# Patient Record
Sex: Female | Born: 1980 | Race: White | Hispanic: No | Marital: Married | Smoking: Never smoker
Health system: Southern US, Community
[De-identification: ages and names within clinical notes are randomized; demographics above are authoritative.]

## PROBLEM LIST (undated history)

## (undated) ENCOUNTER — Inpatient Hospital Stay (HOSPITAL_COMMUNITY): Payer: Self-pay

## (undated) DIAGNOSIS — R102 Pelvic and perineal pain: Secondary | ICD-10-CM

## (undated) DIAGNOSIS — Z973 Presence of spectacles and contact lenses: Secondary | ICD-10-CM

## (undated) DIAGNOSIS — J45909 Unspecified asthma, uncomplicated: Secondary | ICD-10-CM

## (undated) DIAGNOSIS — K5900 Constipation, unspecified: Secondary | ICD-10-CM

## (undated) HISTORY — PX: WISDOM TOOTH EXTRACTION: SHX21

## (undated) HISTORY — PX: BREAST MASS EXCISION: SHX1267

## (undated) HISTORY — PX: BREAST EXCISIONAL BIOPSY: SUR124

---

## 2004-05-26 ENCOUNTER — Other Ambulatory Visit: Admission: RE | Admit: 2004-05-26 | Discharge: 2004-05-26 | Payer: Self-pay | Admitting: Family Medicine

## 2005-05-18 ENCOUNTER — Other Ambulatory Visit: Admission: RE | Admit: 2005-05-18 | Discharge: 2005-05-18 | Payer: Self-pay | Admitting: Obstetrics and Gynecology

## 2008-02-22 ENCOUNTER — Emergency Department (HOSPITAL_COMMUNITY): Admission: EM | Admit: 2008-02-22 | Discharge: 2008-02-22 | Payer: Self-pay | Admitting: Emergency Medicine

## 2008-03-14 ENCOUNTER — Ambulatory Visit (HOSPITAL_COMMUNITY): Admission: RE | Admit: 2008-03-14 | Discharge: 2008-03-14 | Payer: Self-pay | Admitting: Family Medicine

## 2010-07-18 ENCOUNTER — Emergency Department (HOSPITAL_COMMUNITY): Admission: EM | Admit: 2010-07-18 | Discharge: 2010-07-18 | Payer: Self-pay | Admitting: Family Medicine

## 2010-12-30 LAB — POCT URINALYSIS DIPSTICK
Ketones, ur: NEGATIVE mg/dL
Protein, ur: NEGATIVE mg/dL
Specific Gravity, Urine: 1.015 (ref 1.005–1.030)
Urobilinogen, UA: 0.2 mg/dL (ref 0.0–1.0)

## 2011-04-04 ENCOUNTER — Other Ambulatory Visit: Payer: Self-pay | Admitting: Family Medicine

## 2011-04-04 DIAGNOSIS — R1900 Intra-abdominal and pelvic swelling, mass and lump, unspecified site: Secondary | ICD-10-CM

## 2011-04-07 ENCOUNTER — Ambulatory Visit
Admission: RE | Admit: 2011-04-07 | Discharge: 2011-04-07 | Disposition: A | Discharge status: Home / Self Care | Payer: No Typology Code available for payment source | Source: Ambulatory Visit | Attending: Family Medicine | Admitting: Family Medicine

## 2011-04-07 DIAGNOSIS — R1900 Intra-abdominal and pelvic swelling, mass and lump, unspecified site: Secondary | ICD-10-CM

## 2013-03-23 DIAGNOSIS — J45909 Unspecified asthma, uncomplicated: Secondary | ICD-10-CM | POA: Insufficient documentation

## 2013-09-26 LAB — OB RESULTS CONSOLE HEPATITIS B SURFACE ANTIGEN
Hepatitis B Surface Ag: NEGATIVE
Hepatitis B Surface Ag: NEGATIVE

## 2013-09-26 LAB — OB RESULTS CONSOLE HIV ANTIBODY (ROUTINE TESTING)
HIV: NONREACTIVE
HIV: NONREACTIVE

## 2013-09-26 LAB — OB RESULTS CONSOLE ANTIBODY SCREEN
Antibody Screen: NEGATIVE
Antibody Screen: NEGATIVE

## 2013-09-26 LAB — OB RESULTS CONSOLE ABO/RH
"RH Type ": POSITIVE
RH Type: POSITIVE

## 2013-09-26 LAB — OB RESULTS CONSOLE RPR: RPR: NONREACTIVE

## 2013-09-26 LAB — OB RESULTS CONSOLE RUBELLA ANTIBODY, IGM
Rubella: IMMUNE
Rubella: IMMUNE

## 2013-10-17 NOTE — L&D Delivery Note (Signed)
Pt completed the first stage without difficulty. She pushed for 1 1/2 hours and developed exhaustion. The simpson forceps were placed at +3 station in the LOA position. She delivered one live viable white female infant over a second degree midline tear. Placenta - S/I. Nuchal cord x 1 Tear closed with 2-0 and 3-0 Vicryl. EBL- 400cc. Uterine atony was txd with methergine x 1.

## 2014-01-07 ENCOUNTER — Inpatient Hospital Stay (HOSPITAL_COMMUNITY)
Admission: AD | Admit: 2014-01-07 | Discharge: 2014-01-07 | Disposition: A | Discharge status: Home / Self Care | Payer: 59 | Source: Ambulatory Visit | Attending: Obstetrics and Gynecology | Admitting: Obstetrics and Gynecology

## 2014-01-07 ENCOUNTER — Encounter (HOSPITAL_COMMUNITY): Payer: Self-pay

## 2014-01-07 DIAGNOSIS — O4700 False labor before 37 completed weeks of gestation, unspecified trimester: Secondary | ICD-10-CM | POA: Insufficient documentation

## 2014-01-07 DIAGNOSIS — O99891 Other specified diseases and conditions complicating pregnancy: Secondary | ICD-10-CM | POA: Insufficient documentation

## 2014-01-07 DIAGNOSIS — O479 False labor, unspecified: Secondary | ICD-10-CM

## 2014-01-07 DIAGNOSIS — K5900 Constipation, unspecified: Secondary | ICD-10-CM | POA: Insufficient documentation

## 2014-01-07 DIAGNOSIS — K6289 Other specified diseases of anus and rectum: Secondary | ICD-10-CM | POA: Insufficient documentation

## 2014-01-07 DIAGNOSIS — O9989 Other specified diseases and conditions complicating pregnancy, childbirth and the puerperium: Secondary | ICD-10-CM

## 2014-01-07 HISTORY — DX: Constipation, unspecified: K59.00

## 2014-01-07 HISTORY — DX: Unspecified asthma, uncomplicated: J45.909

## 2014-01-07 MED ORDER — DOCUSATE SODIUM 100 MG PO CAPS: 100.0000 mg | ORAL_CAPSULE | Freq: Two times a day (BID) | ORAL | Status: DC

## 2014-01-07 MED ORDER — POLYETHYLENE GLYCOL 3350 17 GM/SCOOP PO POWD: 1.0000 | Freq: Once | ORAL | Status: DC

## 2014-01-07 NOTE — MAU Note (Signed)
Patient states she is severely constipated and having pain and bleeding Had a small hard BM today and tried to disimpact herself and only got out a little. Denies vaginal bleeding or discharge. Has not felt fetal movement today.

## 2014-01-07 NOTE — Discharge Instructions (Signed)
Braxton Hicks Contractions Pregnancy is commonly associated with contractions of the uterus throughout the pregnancy. Towards the end of pregnancy (32 to 34 weeks), these contractions [facility]mael Holter) can develop more often and may become more forceful. This is not true labor because these contractions do not result in opening (dilatation) and thinning of the cervix. They are sometimes difficult to tell apart from true labor because these contractions can be forceful and people have different pain tolerances. You should not feel embarrassed if you go to the hospital with false labor. Sometimes, the only way to tell if you are in true labor is for your caregiver to follow the changes in the cervix. How to tell the difference between true and false labor:  False labor.  The contractions of false labor are usually shorter, irregular and not as hard as those of true labor.  They are often felt in the front of the lower abdomen and in the groin.  They may leave with walking around or changing positions while lying down.  They get weaker and are shorter lasting as time goes on.  These contractions are usually irregular.  They do not usually become progressively stronger, regular and closer together as with true labor.  True labor.  Contractions in true labor last 30 to 70 seconds, become very regular, usually become more intense, and increase in frequency.  They do not go away with walking.  The discomfort is usually felt in the top of the uterus and spreads to the lower abdomen and low back.  True labor can be determined by your caregiver with an exam. This will show that the cervix is dilating and getting thinner. If there are no prenatal problems or other health problems associated with the pregnancy, it is completely safe to be sent home with false labor and await the onset of true labor. HOME CARE INSTRUCTIONS   Keep up with your usual exercises and instructions.  Take medications as  directed.  Keep your regular prenatal appointment.  Eat and drink lightly if you think you are going into labor.  If BH contractions are making you uncomfortable:  Change your activity position from lying down or resting to walking/walking to resting.  Sit and rest in a tub of warm water.  Drink 2 to 3 glasses of water. Dehydration may cause B-H contractions.  Do slow and deep breathing several times an hour. SEEK IMMEDIATE MEDICAL CARE IF:   Your contractions continue to become stronger, more regular, and closer together.  You have a gushing, burst or leaking of fluid from the vagina.  An oral temperature above 102 F (38.9 C) develops.  You have passage of blood-tinged mucus.  You develop vaginal bleeding.  You develop continuous belly (abdominal) pain.  You have low back pain that you never had before.  You feel the baby's head pushing down causing pelvic pressure.  The baby is not moving as much as it used to. Document Released: 10/03/2005 Document Revised: 12/26/2011 Document Reviewed: 07/15/2013 [facility] Patient Information 2014 [place], [place]. Constipation, Adult Constipation is when a person has fewer than 3 bowel movements a week; has difficulty having a bowel movement; or has stools that are dry, hard, or larger than normal. As people grow older, constipation is more common. If you try to fix constipation with medicines that make you have a bowel movement (laxatives), the problem may get worse. Long-term laxative use may cause the muscles of the colon to become weak. A low-fiber diet, not taking in  enough fluids, and taking certain medicines may make constipation worse. CAUSES   Certain medicines, such as antidepressants, pain medicine, iron supplements, antacids, and water pills.   Certain diseases, such as diabetes, irritable bowel syndrome (IBS), thyroid disease, or depression.   Not drinking enough water.   Not eating enough fiber-rich foods.    Stress or travel.  Lack of physical activity or exercise.  Not going to the restroom when there is the urge to have a bowel movement.  Ignoring the urge to have a bowel movement.  Using laxatives too much. SYMPTOMS   Having fewer than 3 bowel movements a week.   Straining to have a bowel movement.   Having hard, dry, or larger than normal stools.   Feeling full or bloated.   Pain in the lower abdomen.  Not feeling relief after having a bowel movement. DIAGNOSIS  Your caregiver will take a medical history and perform a physical exam. Further testing may be done for severe constipation. Some tests may include:   A barium enema X-ray to examine your rectum, colon, and sometimes, your small intestine.  A sigmoidoscopy to examine your lower colon.  A colonoscopy to examine your entire colon. TREATMENT  Treatment will depend on the severity of your constipation and what is causing it. Some dietary treatments include drinking more fluids and eating more fiber-rich foods. Lifestyle treatments may include regular exercise. If these diet and lifestyle recommendations do not help, your caregiver may recommend taking over-the-counter laxative medicines to help you have bowel movements. Prescription medicines may be prescribed if over-the-counter medicines do not work.  HOME CARE INSTRUCTIONS   Increase dietary fiber in your diet, such as fruits, vegetables, whole grains, and beans. Limit high-fat and processed sugars in your diet, such as [place] fries, hamburgers, cookies, candies, and soda.   A fiber supplement may be added to your diet if you cannot get enough fiber from foods.   Drink enough fluids to keep your urine clear or pale yellow.   Exercise regularly or as directed by your caregiver.   Go to the restroom when you have the urge to go. Do not hold it.  Only take medicines as directed by your caregiver. Do not take other medicines for constipation without talking  to your caregiver first. [place] IF:   You have bright red blood in your stool.   Your constipation lasts for more than 4 days or gets worse.   You have abdominal or rectal pain.   You have thin, pencil-like stools.  You have unexplained weight loss. MAKE SURE YOU:   Understand these instructions.  Will watch your condition.  Will get help right away if you are not doing well or get worse. Document Released: 07/01/2004 Document Revised: 12/26/2011 Document Reviewed: 07/15/2013 [facility] Patient Information 2014 [place], [place]. Fetal Movement Counts Patient Name: __________________________________________________ Patient Due Date: ____________________ Performing a fetal movement count is highly recommended in high-risk pregnancies, but it is good for every pregnant woman to do. Your caregiver may ask you to start counting fetal movements at 28 weeks of the pregnancy. Fetal movements often increase:  After eating a full meal.  After physical activity.  After eating or drinking something sweet or cold.  At rest. Pay attention to when you feel the baby is most active. This will help you notice a pattern of your baby's sleep and wake cycles and what factors contribute to an increase in fetal movement. It is important to perform a fetal  movement count at the same time each day when your baby is normally most active.  HOW TO COUNT FETAL MOVEMENTS 1. Find a quiet and comfortable area to sit or lie down on your left side. Lying on your left side provides the best blood and oxygen circulation to your baby. 2. Write down the day and time on a sheet of paper or in a journal. 3. Start counting kicks, flutters, swishes, rolls, or jabs in a 2 hour period. You should feel at least 10 movements within 2 hours. 4. If you do not feel 10 movements in 2 hours, wait 2 3 hours and count again. Look for a change in the pattern or not enough counts in 2 hours. SEEK MEDICAL CARE  IF:  You feel less than 10 counts in 2 hours, tried twice.  There is no movement in over an hour.  The pattern is changing or taking longer each day to reach 10 counts in 2 hours.  You feel the baby is not moving as he or she usually does. Date: ____________ Movements: ____________ Start time: ____________ Bonnie Atkins time: ____________  Date: ____________ Movements: ____________ Start time: ____________ Bonnie Atkins time: ____________ Date: ____________ Movements: ____________ Start time: ____________ Bonnie Atkins time: ____________ Date: ____________ Movements: ____________ Start time: ____________ Bonnie Atkins time: ____________ Date: ____________ Movements: ____________ Start time: ____________ Bonnie Atkins time: ____________ Date: ____________ Movements: ____________ Start time: ____________ Bonnie Atkins time: ____________ Date: ____________ Movements: ____________ Start time: ____________ Bonnie Atkins time: ____________ Date: ____________ Movements: ____________ Start time: ____________ Bonnie Atkins time: ____________  Date: ____________ Movements: ____________ Start time: ____________ Bonnie Atkins time: ____________ Date: ____________ Movements: ____________ Start time: ____________ Bonnie Atkins time: ____________ Date: ____________ Movements: ____________ Start time: ____________ Bonnie Atkins time: ____________ Date: ____________ Movements: ____________ Start time: ____________ Bonnie Atkins time: ____________ Date: ____________ Movements: ____________ Start time: ____________ Bonnie Atkins time: ____________ Date: ____________ Movements: ____________ Start time: ____________ Bonnie Atkins time: ____________ Date: ____________ Movements: ____________ Start time: ____________ Bonnie Atkins time: ____________  Date: ____________ Movements: ____________ Start time: ____________ Bonnie Atkins time: ____________ Date: ____________ Movements: ____________ Start time: ____________ Bonnie Atkins time: ____________ Date: ____________ Movements: ____________ Start time: ____________ Bonnie Atkins time:  ____________ Date: ____________ Movements: ____________ Start time: ____________ Bonnie Atkins time: ____________ Date: ____________ Movements: ____________ Start time: ____________ Bonnie Atkins time: ____________ Date: ____________ Movements: ____________ Start time: ____________ Bonnie Atkins time: ____________ Date: ____________ Movements: ____________ Start time: ____________ Bonnie Atkins time: ____________  Date: ____________ Movements: ____________ Start time: ____________ Bonnie Atkins time: ____________ Date: ____________ Movements: ____________ Start time: ____________ Bonnie Atkins time: ____________ Date: ____________ Movements: ____________ Start time: ____________ Bonnie Atkins time: ____________ Date: ____________ Movements: ____________ Start time: ____________ Bonnie Atkins time: ____________ Date: ____________ Movements: ____________ Start time: ____________ Bonnie Atkins time: ____________ Date: ____________ Movements: ____________ Start time: ____________ Bonnie Atkins time: ____________ Date: ____________ Movements: ____________ Start time: ____________ Bonnie Atkins time: ____________  Date: ____________ Movements: ____________ Start time: ____________ Bonnie Atkins time: ____________ Date: ____________ Movements: ____________ Start time: ____________ Bonnie Atkins time: ____________ Date: ____________ Movements: ____________ Start time: ____________ Bonnie Atkins time: ____________ Date: ____________ Movements: ____________ Start time: ____________ Bonnie Atkins time: ____________ Date: ____________ Movements: ____________ Start time: ____________ Bonnie Atkins time: ____________ Date: ____________ Movements: ____________ Start time: ____________ Bonnie Atkins time: ____________ Date: ____________ Movements: ____________ Start time: ____________ Bonnie Atkins time: ____________  Date: ____________ Movements: ____________ Start time: ____________ Bonnie Atkins time: ____________ Date: ____________ Movements: ____________ Start time: ____________ Bonnie Atkins time: ____________ Date: ____________ Movements:  ____________ Start time: ____________ Bonnie Atkins time: ____________ Date: ____________ Movements: ____________ Start time: ____________ Bonnie Atkins time: ____________ Date: ____________ Movements: ____________ Start time: ____________ Bonnie Atkins time: ____________ Date:  ____________ Movements: ____________ Start time: ____________ Bonnie Atkins time: ____________ Date: ____________ Movements: ____________ Start time: ____________ Bonnie Atkins time: ____________  Date: ____________ Movements: ____________ Start time: ____________ Bonnie Atkins time: ____________ Date: ____________ Movements: ____________ Start time: ____________ Bonnie Atkins time: ____________ Date: ____________ Movements: ____________ Start time: ____________ Bonnie Atkins time: ____________ Date: ____________ Movements: ____________ Start time: ____________ Bonnie Atkins time: ____________ Date: ____________ Movements: ____________ Start time: ____________ Bonnie Atkins time: ____________ Date: ____________ Movements: ____________ Start time: ____________ Bonnie Atkins time: ____________ Date: ____________ Movements: ____________ Start time: ____________ Bonnie Atkins time: ____________  Date: ____________ Movements: ____________ Start time: ____________ Bonnie Atkins time: ____________ Date: ____________ Movements: ____________ Start time: ____________ Bonnie Atkins time: ____________ Date: ____________ Movements: ____________ Start time: ____________ Bonnie Atkins time: ____________ Date: ____________ Movements: ____________ Start time: ____________ Bonnie Atkins time: ____________ Date: ____________ Movements: ____________ Start time: ____________ Bonnie Atkins time: ____________ Date: ____________ Movements: ____________ Start time: ____________ Bonnie Atkins time: ____________ Document Released: 11/02/2006 Document Revised: 09/19/2012 Document Reviewed: 07/30/2012 [employer] Patient Information 2014 [place].

## 2014-01-07 NOTE — MAU Provider Note (Signed)
History     CSN: [id number]  Arrival date and time: 01/07/14 1806   First Provider Initiated Contact with Patient 01/07/14 2003      Chief Complaint  Patient presents with  . Constipation   HPI Bonnie Atkins is a 33 y.o. G1P0 at [redacted]w[redacted]d who presents to MAU today with complaint of severe rectal pain and constipation. The patient states that it became very painful around 1700 today. She has also developed some mild nausea without vomiting. She denies fever. Her last normal BM was 2-3 days ago. She states that she attempted to manually disimpact herself with little success. She is now also having anal leakage and occasional scant rectal bleeding. She denies vaginal bleeding, contractions, LOF. She reports good fetal movement.   OB History   Grav Para Term Preterm Abortions TAB SAB Ect Mult Living   1         0      Past Medical History  Diagnosis Date  . Asthma   . Constipation     Past Surgical History  Procedure Laterality Date  . Breast mass excision      Benign  . Wisdom tooth extraction      No family history on file.  History  Substance Use Topics  . Smoking status: Never Smoker   . Smokeless tobacco: Never Used  . Alcohol Use: No    Allergies:  Allergies  Allergen Reactions  . Erythromycin Anaphylaxis  . Sulfa Antibiotics Nausea And Vomiting  . Codeine Nausea And Vomiting and Rash  . Fish-Derived Products Palpitations    No prescriptions prior to admission    Review of Systems  Constitutional: Negative for fever and malaise/fatigue.  Gastrointestinal: Positive for nausea, diarrhea and constipation. Negative for vomiting and abdominal pain.  Genitourinary:       Neg - vaginal bleeding, discharge, LOF   Physical Exam   Blood pressure 119/72, pulse 89, temperature 98.2 F (36.8 C), temperature source Oral, resp. rate 18, height 5' 4.5" (1.638 m), weight 144 lb (65.318 kg), SpO2 100.00%.  Physical Exam  Constitutional: She is oriented to person,  place, and time. She appears well-developed and well-nourished. No distress.  HENT:  Head: Normocephalic and atraumatic.  Cardiovascular: Normal rate.   Respiratory: Effort normal.  GI: Soft. She exhibits no distension and no mass. There is no tenderness. There is no rebound and no guarding.  Neurological: She is alert and oriented to person, place, and time.  Skin: Skin is warm and dry. No erythema.  Psychiatric: She has a normal mood and affect.  Dilation: Closed Effacement (%): Thick Cervical Position: Anterior Exam by:: J Ethier PA-C  Fetal Monitoring: Baseline: 135 bpm, moderate variability, 10 x 10 accelerations, no decelerations Contractions: q 2-5 minutes progressing to mild UI after PO hydration  MAU Course  Procedures None  MDM Discussed with Dr. Ouida Sills. Plan to use enema, check cervix. If no dilation patient can be sent home with precautions. Ok with plan of care Enema given. Patient able to have BM x 2 and significant relief of rectal pain PO hydration > Uterine contractions slowed to mild UI Patient denies contractions or abdominal pain Cervix closed  Assessment and Plan  A: Braxton Hicks contractions Constipation  P: Discharge home Rx for Miralax and Colace given to patient Advised increased PO hydration Preterm labor precautions and kick counts discussed Patient to follow-up with [facility] as scheduled or sooner if symptoms persist or worsen Patient may return to MAU as needed  or if her condtiion were to change or worsen  Farris Has, PA-C  01/08/2014, 12:38 AM

## 2014-01-22 LAB — OB RESULTS CONSOLE RPR: RPR: NONREACTIVE

## 2014-03-18 LAB — OB RESULTS CONSOLE GBS
GBS: NEGATIVE
GBS: NEGATIVE

## 2014-04-12 ENCOUNTER — Inpatient Hospital Stay (HOSPITAL_COMMUNITY)
Admission: AD | Admit: 2014-04-12 | Discharge: 2014-04-13 | Disposition: A | Discharge status: Home / Self Care | Payer: 59 | Source: Ambulatory Visit | Attending: Obstetrics and Gynecology | Admitting: Obstetrics and Gynecology

## 2014-04-12 DIAGNOSIS — O479 False labor, unspecified: Secondary | ICD-10-CM | POA: Insufficient documentation

## 2014-04-12 NOTE — MAU Note (Signed)
contractions 

## 2014-04-13 ENCOUNTER — Inpatient Hospital Stay (HOSPITAL_COMMUNITY)
Admission: AD | Admit: 2014-04-13 | Discharge: 2014-04-15 | DRG: 775 | Disposition: A | Discharge status: Home / Self Care | Payer: 59 | Source: Ambulatory Visit | Attending: Obstetrics and Gynecology | Admitting: Obstetrics and Gynecology

## 2014-04-13 ENCOUNTER — Encounter (HOSPITAL_COMMUNITY): Payer: Self-pay | Admitting: *Deleted

## 2014-04-13 ENCOUNTER — Encounter (HOSPITAL_COMMUNITY): Payer: 59 | Admitting: Anesthesiology

## 2014-04-13 ENCOUNTER — Inpatient Hospital Stay (HOSPITAL_COMMUNITY): Payer: 59 | Admitting: Anesthesiology

## 2014-04-13 DIAGNOSIS — D6859 Other primary thrombophilia: Secondary | ICD-10-CM | POA: Diagnosis present

## 2014-04-13 DIAGNOSIS — O9912 Other diseases of the blood and blood-forming organs and certain disorders involving the immune mechanism complicating childbirth: Secondary | ICD-10-CM

## 2014-04-13 DIAGNOSIS — IMO0001 Reserved for inherently not codable concepts without codable children: Secondary | ICD-10-CM

## 2014-04-13 DIAGNOSIS — J45909 Unspecified asthma, uncomplicated: Secondary | ICD-10-CM | POA: Diagnosis present

## 2014-04-13 DIAGNOSIS — Z3480 Encounter for supervision of other normal pregnancy, unspecified trimester: Secondary | ICD-10-CM

## 2014-04-13 DIAGNOSIS — D689 Coagulation defect, unspecified: Secondary | ICD-10-CM | POA: Diagnosis present

## 2014-04-13 DIAGNOSIS — O479 False labor, unspecified: Secondary | ICD-10-CM | POA: Diagnosis present

## 2014-04-13 LAB — CBC
HEMATOCRIT: 38.2 % (ref 36.0–46.0)
HEMOGLOBIN: 13 g/dL (ref 12.0–15.0)
MCH: 30.7 pg (ref 26.0–34.0)
MCHC: 34 g/dL (ref 30.0–36.0)
MCV: 90.1 fL (ref 78.0–100.0)
Platelets: 206 10*3/uL (ref 150–400)
RBC: 4.24 MIL/uL (ref 3.87–5.11)
RDW: 13.8 % (ref 11.5–15.5)
WBC: 18.5 10*3/uL — ABNORMAL HIGH (ref 4.0–10.5)

## 2014-04-13 LAB — RPR

## 2014-04-13 MED ORDER — SENNOSIDES-DOCUSATE SODIUM 8.6-50 MG PO TABS
2.0000 | ORAL_TABLET | ORAL | Status: DC
  Administered 2014-04-14 (×2): 2 via ORAL
  Filled 2014-04-13 (×2): qty 2

## 2014-04-13 MED ORDER — ONDANSETRON HCL 4 MG/2ML IJ SOLN: 4.0000 mg | INTRAMUSCULAR | Status: DC | PRN

## 2014-04-13 MED ORDER — FLEET ENEMA 7-19 GM/118ML RE ENEM: 1.0000 | ENEMA | RECTAL | Status: DC | PRN

## 2014-04-13 MED ORDER — ZOLPIDEM TARTRATE 5 MG PO TABS: 5.0000 mg | ORAL_TABLET | Freq: Every evening | ORAL | Status: DC | PRN

## 2014-04-13 MED ORDER — DIPHENHYDRAMINE HCL 50 MG/ML IJ SOLN: 12.5000 mg | INTRAMUSCULAR | Status: DC | PRN

## 2014-04-13 MED ORDER — PHENYLEPHRINE 40 MCG/ML (10ML) SYRINGE FOR IV PUSH (FOR BLOOD PRESSURE SUPPORT)
80.0000 ug | PREFILLED_SYRINGE | INTRAVENOUS | Status: DC | PRN
  Filled 2014-04-13: qty 10
  Filled 2014-04-13: qty 2

## 2014-04-13 MED ORDER — PHENYLEPHRINE 40 MCG/ML (10ML) SYRINGE FOR IV PUSH (FOR BLOOD PRESSURE SUPPORT)
80.0000 ug | PREFILLED_SYRINGE | INTRAVENOUS | Status: DC | PRN
  Filled 2014-04-13: qty 2

## 2014-04-13 MED ORDER — IBUPROFEN 600 MG PO TABS
600.0000 mg | ORAL_TABLET | Freq: Four times a day (QID) | ORAL | Status: DC
  Administered 2014-04-14 – 2014-04-15 (×6): 600 mg via ORAL
  Filled 2014-04-13 (×6): qty 1

## 2014-04-13 MED ORDER — TETANUS-DIPHTH-ACELL PERTUSSIS 5-2.5-18.5 LF-MCG/0.5 IM SUSP
0.5000 mL | Freq: Once | INTRAMUSCULAR | Status: AC
  Administered 2014-04-14: 0.5 mL via INTRAMUSCULAR
  Filled 2014-04-13: qty 0.5

## 2014-04-13 MED ORDER — FENTANYL 2.5 MCG/ML BUPIVACAINE 1/10 % EPIDURAL INFUSION (WH - ANES)
INTRAMUSCULAR | Status: DC | PRN
  Administered 2014-04-13: 14 mL/h via EPIDURAL

## 2014-04-13 MED ORDER — LIDOCAINE HCL (PF) 1 % IJ SOLN
INTRAMUSCULAR | Status: DC | PRN
  Administered 2014-04-13 (×2): 9 mL

## 2014-04-13 MED ORDER — ONDANSETRON HCL 4 MG/2ML IJ SOLN
4.0000 mg | Freq: Four times a day (QID) | INTRAMUSCULAR | Status: DC | PRN
  Administered 2014-04-13: 4 mg via INTRAVENOUS
  Filled 2014-04-13: qty 2

## 2014-04-13 MED ORDER — LACTATED RINGERS IV SOLN
500.0000 mL | Freq: Once | INTRAVENOUS | Status: AC
  Administered 2014-04-13: 500 mL via INTRAVENOUS

## 2014-04-13 MED ORDER — DIBUCAINE 1 % RE OINT: 1.0000 "application " | TOPICAL_OINTMENT | RECTAL | Status: DC | PRN

## 2014-04-13 MED ORDER — LACTATED RINGERS IV SOLN: 500.0000 mL | INTRAVENOUS | Status: DC | PRN

## 2014-04-13 MED ORDER — IBUPROFEN 600 MG PO TABS
600.0000 mg | ORAL_TABLET | Freq: Four times a day (QID) | ORAL | Status: DC | PRN
  Administered 2014-04-13: 600 mg via ORAL
  Filled 2014-04-13: qty 1

## 2014-04-13 MED ORDER — EPHEDRINE 5 MG/ML INJ
10.0000 mg | INTRAVENOUS | Status: DC | PRN
  Filled 2014-04-13: qty 4
  Filled 2014-04-13: qty 2

## 2014-04-13 MED ORDER — FENTANYL 2.5 MCG/ML BUPIVACAINE 1/10 % EPIDURAL INFUSION (WH - ANES)
14.0000 mL/h | INTRAMUSCULAR | Status: DC | PRN
  Administered 2014-04-13: 14 mL/h via EPIDURAL
  Filled 2014-04-13: qty 125

## 2014-04-13 MED ORDER — ZOLPIDEM TARTRATE 5 MG PO TABS
5.0000 mg | ORAL_TABLET | Freq: Once | ORAL | Status: AC
  Administered 2014-04-13: 5 mg via ORAL
  Filled 2014-04-13: qty 1

## 2014-04-13 MED ORDER — OXYCODONE-ACETAMINOPHEN 5-325 MG PO TABS
1.0000 | ORAL_TABLET | ORAL | Status: DC | PRN
  Administered 2014-04-14 (×2): 1 via ORAL
  Filled 2014-04-13 (×2): qty 1

## 2014-04-13 MED ORDER — LIDOCAINE HCL (PF) 1 % IJ SOLN
30.0000 mL | INTRAMUSCULAR | Status: DC | PRN
  Filled 2014-04-13: qty 30

## 2014-04-13 MED ORDER — CITRIC ACID-SODIUM CITRATE 334-500 MG/5ML PO SOLN: 30.0000 mL | ORAL | Status: DC | PRN

## 2014-04-13 MED ORDER — ALBUTEROL SULFATE (2.5 MG/3ML) 0.083% IN NEBU: 3.0000 mL | INHALATION_SOLUTION | Freq: Four times a day (QID) | RESPIRATORY_TRACT | Status: DC | PRN

## 2014-04-13 MED ORDER — WITCH HAZEL-GLYCERIN EX PADS: 1.0000 "application " | MEDICATED_PAD | CUTANEOUS | Status: DC | PRN

## 2014-04-13 MED ORDER — METHYLERGONOVINE MALEATE 0.2 MG/ML IJ SOLN
INTRAMUSCULAR | Status: AC
  Administered 2014-04-13: 0.2 mg
  Filled 2014-04-13: qty 1

## 2014-04-13 MED ORDER — LACTATED RINGERS IV SOLN
INTRAVENOUS | Status: DC
  Administered 2014-04-13 (×2): via INTRAVENOUS

## 2014-04-13 MED ORDER — ONDANSETRON HCL 4 MG PO TABS: 4.0000 mg | ORAL_TABLET | ORAL | Status: DC | PRN

## 2014-04-13 MED ORDER — MEASLES, MUMPS & RUBELLA VAC ~~LOC~~ INJ
0.5000 mL | INJECTION | Freq: Once | SUBCUTANEOUS | Status: DC
  Filled 2014-04-13: qty 0.5

## 2014-04-13 MED ORDER — OXYTOCIN BOLUS FROM INFUSION
500.0000 mL | INTRAVENOUS | Status: DC
  Administered 2014-04-13: 500 mL via INTRAVENOUS

## 2014-04-13 MED ORDER — SIMETHICONE 80 MG PO CHEW: 80.0000 mg | CHEWABLE_TABLET | ORAL | Status: DC | PRN

## 2014-04-13 MED ORDER — BENZOCAINE-MENTHOL 20-0.5 % EX AERO
1.0000 "application " | INHALATION_SPRAY | CUTANEOUS | Status: DC | PRN
  Administered 2014-04-15: 1 via TOPICAL
  Filled 2014-04-13 (×2): qty 56

## 2014-04-13 MED ORDER — OXYTOCIN 40 UNITS IN LACTATED RINGERS INFUSION - SIMPLE MED
62.5000 mL/h | INTRAVENOUS | Status: DC
Start: 2014-04-13 — End: 2014-04-13
  Filled 2014-04-13: qty 1000

## 2014-04-13 MED ORDER — EPHEDRINE 5 MG/ML INJ
10.0000 mg | INTRAVENOUS | Status: DC | PRN
  Filled 2014-04-13: qty 2

## 2014-04-13 MED ORDER — ACETAMINOPHEN 325 MG PO TABS: 650.0000 mg | ORAL_TABLET | ORAL | Status: DC | PRN

## 2014-04-13 NOTE — H&P (Signed)
33 y.o. [redacted]w[redacted]d  G1P0 comes in c/o labor.  Otherwise has good fetal movement and no bleeding.  Past Medical History  Diagnosis Date  . Constipation   . Asthma     Past Surgical History  Procedure Laterality Date  . Breast mass excision      Benign  . Wisdom tooth extraction      OB History  Gravida Para Term Preterm AB SAB TAB Ectopic Multiple Living  1         0    # Outcome Date GA Lbr Len/2nd Weight Sex Delivery Anes PTL Lv  1 CUR               History   Social History  . Marital Status: Married    Spouse Name: N/A    Number of Children: N/A  . Years of Education: N/A   Occupational History  . Not on file.   Social History Main Topics  . Smoking status: Never Smoker   . Smokeless tobacco: Never Used  . Alcohol Use: No  . Drug Use: No  . Sexual Activity: Yes    Birth Control/ Protection: None   Other Topics Concern  . Not on file   Social History Narrative  . No narrative on file   Erythromycin; Sulfa antibiotics; Codeine; and Fish-derived products    Prenatal Transfer Tool  Maternal Diabetes: No Genetic Screening: Normal Maternal Ultrasounds/Referrals: Normal Fetal Ultrasounds or other Referrals:  None Maternal Substance Abuse:  No Significant Maternal Medications:  None Significant Maternal Lab Results: None  Other PNC: uncomplicated.    Filed Vitals:   04/13/14 1323  BP:   Pulse:   Temp: 97.9 F (36.6 C)  Resp:      Lungs/Cor:  NAD Abdomen:  soft, gravid Ex:  no cords, erythema SVE:  7/C/-2, AROM light mec FHTs:  120, good STV, NST R Toco:  q 2-3   A/P   Term labor.  GBS neg.  HORVATH,MICHELLE A

## 2014-04-13 NOTE — MAU Note (Signed)
Pt. States she has been contracting since 1500 today. Denies leakage of fluid. Feels that she passed her mucous plug Wednesday. Had last appointment with OB Wednesday and was closed on exam. Pt. States she is nauseated. Baby is moving well. Denies bleeding.

## 2014-04-13 NOTE — Anesthesia Procedure Notes (Signed)
Epidural Patient location during procedure: OB Start time: 04/13/2014 11:46 AM End time: 04/13/2014 11:50 AM  Staffing Anesthesiologist: Lyn Hollingshead Performed by: anesthesiologist   Preanesthetic Checklist Completed: patient identified, surgical consent, pre-op evaluation, timeout performed, IV checked, risks and benefits discussed and monitors and equipment checked  Epidural Patient position: sitting Prep: site prepped and draped and DuraPrep Patient monitoring: continuous pulse ox and blood pressure Approach: midline Location: L3-L4 Injection technique: LOR air  Needle:  Needle type: Tuohy  Needle gauge: 17 G Needle length: 9 cm and 9 Needle insertion depth: 4 cm Catheter type: closed end flexible Catheter size: 19 Gauge Catheter at skin depth: 9 cm Test dose: negative and Other  Assessment Sensory level: T9 Events: blood not aspirated, injection not painful, no injection resistance, negative IV test and no paresthesia  Additional Notes Reason for block:procedure for pain

## 2014-04-13 NOTE — Anesthesia Preprocedure Evaluation (Signed)
Anesthesia Evaluation  Patient identified by MRN, date of birth, ID band Patient awake    Reviewed: Allergy & Precautions, H&P , NPO status , Patient's Chart, lab work & pertinent test results  Airway Mallampati: I TM Distance: >3 FB Neck ROM: full    Dental no notable dental hx.    Pulmonary    Pulmonary exam normal       Cardiovascular negative cardio ROS      Neuro/Psych negative neurological ROS  negative psych ROS   GI/Hepatic negative GI ROS, Neg liver ROS,   Endo/Other  negative endocrine ROS  Renal/GU negative Renal ROS     Musculoskeletal   Abdominal Normal abdominal exam  (+)   Peds  Hematology negative hematology ROS (+)   Anesthesia Other Findings   Reproductive/Obstetrics (+) Pregnancy                           Anesthesia Physical Anesthesia Plan  ASA: II  Anesthesia Plan: Epidural   Post-op Pain Management:    Induction:   Airway Management Planned:   Additional Equipment:   Intra-op Plan:   Post-operative Plan:   Informed Consent: I have reviewed the patients History and Physical, chart, labs and discussed the procedure including the risks, benefits and alternatives for the proposed anesthesia with the patient or authorized representative who has indicated his/her understanding and acceptance.     Plan Discussed with:   Anesthesia Plan Comments:         Anesthesia Quick Evaluation

## 2014-04-13 NOTE — Discharge Instructions (Signed)
Fetal Movement Counts Patient Name: __________________________________________________ Patient Due Date: ____________________ Performing a fetal movement count is highly recommended in high-risk pregnancies, but it is good for every pregnant woman to do. Your caregiver may ask you to start counting fetal movements at 28 weeks of the pregnancy. Fetal movements often increase:  After eating a full meal.  After physical activity.  After eating or drinking something sweet or cold.  At rest. Pay attention to when you feel the baby is most active. This will help you notice a pattern of your baby's sleep and wake cycles and what factors contribute to an increase in fetal movement. It is important to perform a fetal movement count at the same time each day when your baby is normally most active.  HOW TO COUNT FETAL MOVEMENTS 1. Find a quiet and comfortable area to sit or lie down on your left side. Lying on your left side provides the best blood and oxygen circulation to your baby. 2. Write down the day and time on a sheet of paper or in a journal. 3. Start counting kicks, flutters, swishes, rolls, or jabs in a 2 hour period. You should feel at least 10 movements within 2 hours. 4. If you do not feel 10 movements in 2 hours, wait 2-3 hours and count again. Look for a change in the pattern or not enough counts in 2 hours. SEEK MEDICAL CARE IF:  You feel less than 10 counts in 2 hours, tried twice.  There is no movement in over an hour.  The pattern is changing or taking longer each day to reach 10 counts in 2 hours.  You feel the baby is not moving as he or she usually does. Date: ____________ Movements: ____________ Start time: ____________ Elizebeth Koller time: ____________  Date: ____________ Movements: ____________ Start time: ____________ Elizebeth Koller time: ____________ Date: ____________ Movements: ____________ Start time: ____________ Elizebeth Koller time: ____________ Date: ____________ Movements: ____________  Start time: ____________ Elizebeth Koller time: ____________ Date: ____________ Movements: ____________ Start time: ____________ Elizebeth Koller time: ____________ Date: ____________ Movements: ____________ Start time: ____________ Elizebeth Koller time: ____________ Date: ____________ Movements: ____________ Start time: ____________ Elizebeth Koller time: ____________ Date: ____________ Movements: ____________ Start time: ____________ Elizebeth Koller time: ____________  Date: ____________ Movements: ____________ Start time: ____________ Elizebeth Koller time: ____________ Date: ____________ Movements: ____________ Start time: ____________ Elizebeth Koller time: ____________ Date: ____________ Movements: ____________ Start time: ____________ Elizebeth Koller time: ____________ Date: ____________ Movements: ____________ Start time: ____________ Elizebeth Koller time: ____________ Date: ____________ Movements: ____________ Start time: ____________ Elizebeth Koller time: ____________ Date: ____________ Movements: ____________ Start time: ____________ Elizebeth Koller time: ____________ Date: ____________ Movements: ____________ Start time: ____________ Elizebeth Koller time: ____________  Date: ____________ Movements: ____________ Start time: ____________ Elizebeth Koller time: ____________ Date: ____________ Movements: ____________ Start time: ____________ Elizebeth Koller time: ____________ Date: ____________ Movements: ____________ Start time: ____________ Elizebeth Koller time: ____________ Date: ____________ Movements: ____________ Start time: ____________ Elizebeth Koller time: ____________ Date: ____________ Movements: ____________ Start time: ____________ Elizebeth Koller time: ____________ Date: ____________ Movements: ____________ Start time: ____________ Elizebeth Koller time: ____________ Date: ____________ Movements: ____________ Start time: ____________ Elizebeth Koller time: ____________  Date: ____________ Movements: ____________ Start time: ____________ Elizebeth Koller time: ____________ Date: ____________ Movements: ____________ Start time: ____________ Elizebeth Koller time:  ____________ Date: ____________ Movements: ____________ Start time: ____________ Elizebeth Koller time: ____________ Date: ____________ Movements: ____________ Start time: ____________ Elizebeth Koller time: ____________ Date: ____________ Movements: ____________ Start time: ____________ Elizebeth Koller time: ____________ Date: ____________ Movements: ____________ Start time: ____________ Elizebeth Koller time: ____________ Date: ____________ Movements: ____________ Start time: ____________ Elizebeth Koller time: ____________  Date: ____________ Movements: ____________ Start time: ____________ Elizebeth Koller time:  ____________ Date: ____________ Movements: ____________ Start time: ____________ Elizebeth Koller time: ____________ Date: ____________ Movements: ____________ Start time: ____________ Elizebeth Koller time: ____________ Date: ____________ Movements: ____________ Start time: ____________ Elizebeth Koller time: ____________ Date: ____________ Movements: ____________ Start time: ____________ Elizebeth Koller time: ____________ Date: ____________ Movements: ____________ Start time: ____________ Elizebeth Koller time: ____________ Date: ____________ Movements: ____________ Start time: ____________ Elizebeth Koller time: ____________  Date: ____________ Movements: ____________ Start time: ____________ Elizebeth Koller time: ____________ Date: ____________ Movements: ____________ Start time: ____________ Elizebeth Koller time: ____________ Date: ____________ Movements: ____________ Start time: ____________ Elizebeth Koller time: ____________ Date: ____________ Movements: ____________ Start time: ____________ Elizebeth Koller time: ____________ Date: ____________ Movements: ____________ Start time: ____________ Elizebeth Koller time: ____________ Date: ____________ Movements: ____________ Start time: ____________ Elizebeth Koller time: ____________ Date: ____________ Movements: ____________ Start time: ____________ Elizebeth Koller time: ____________  Date: ____________ Movements: ____________ Start time: ____________ Elizebeth Koller time: ____________ Date: ____________ Movements:  ____________ Start time: ____________ Elizebeth Koller time: ____________ Date: ____________ Movements: ____________ Start time: ____________ Elizebeth Koller time: ____________ Date: ____________ Movements: ____________ Start time: ____________ Elizebeth Koller time: ____________ Date: ____________ Movements: ____________ Start time: ____________ Elizebeth Koller time: ____________ Date: ____________ Movements: ____________ Start time: ____________ Elizebeth Koller time: ____________ Date: ____________ Movements: ____________ Start time: ____________ Elizebeth Koller time: ____________  Date: ____________ Movements: ____________ Start time: ____________ Elizebeth Koller time: ____________ Date: ____________ Movements: ____________ Start time: ____________ Elizebeth Koller time: ____________ Date: ____________ Movements: ____________ Start time: ____________ Elizebeth Koller time: ____________ Date: ____________ Movements: ____________ Start time: ____________ Elizebeth Koller time: ____________ Date: ____________ Movements: ____________ Start time: ____________ Elizebeth Koller time: ____________ Date: ____________ Movements: ____________ Start time: ____________ Elizebeth Koller time: ____________ Document Released: 11/02/2006 Document Revised: 09/19/2012 Document Reviewed: 07/30/2012 [employer] Patient Information 2015 [employer]. This information is not intended to replace advice given to you by your health care provider. Make sure you discuss any questions you have with your health care provider. Braxton Hicks Contractions Contractions of the uterus can occur throughout pregnancy. Contractions are not always a sign that you are in labor.  WHAT ARE BRAXTON HICKS CONTRACTIONS?  Contractions that occur before labor are called Braxton Hicks contractions, or false labor. Toward the end of pregnancy (32-34 weeks), these contractions can develop more often and may become more forceful. This is not true labor because these contractions do not result in opening (dilatation) and thinning of the cervix. They are  sometimes difficult to tell apart from true labor because these contractions can be forceful and people have different pain tolerances. You should not feel embarrassed if you go to the hospital with false labor. Sometimes, the only way to tell if you are in true labor is for your health care provider to look for changes in the cervix. If there are no prenatal problems or other health problems associated with the pregnancy, it is completely safe to be sent home with false labor and await the onset of true labor. HOW CAN YOU TELL THE DIFFERENCE BETWEEN TRUE AND FALSE LABOR? False Labor  The contractions of false labor are usually shorter and not as hard as those of true labor.   The contractions are usually irregular.   The contractions are often felt in the front of the lower abdomen and in the groin.   The contractions may go away when you walk around or change positions while lying down.   The contractions get weaker and are shorter lasting as time goes on.   The contractions do not usually become progressively stronger, regular, and closer together as with true labor.  True Labor  Contractions in true labor last 30-70 seconds, become very  regular, usually become more intense, and increase in frequency.   The contractions do not go away with walking.   The discomfort is usually felt in the top of the uterus and spreads to the lower abdomen and low back.   True labor can be determined by your health care provider with an exam. This will show that the cervix is dilating and getting thinner.  WHAT TO REMEMBER  Keep up with your usual exercises and follow other instructions given by your health care provider.   Take medicines as directed by your health care provider.   Keep your regular prenatal appointments.   Eat and drink lightly if you think you are going into labor.   If Braxton Hicks contractions are making you uncomfortable:   Change your position from lying  down or resting to walking, or from walking to resting.   Sit and rest in a tub of warm water.   Drink 2-3 glasses of water. Dehydration may cause these contractions.   Do slow and deep breathing several times an hour.  WHEN SHOULD I SEEK IMMEDIATE MEDICAL CARE? Seek immediate medical care if:  Your contractions become stronger, more regular, and closer together.   You have fluid leaking or gushing from your vagina.   You have a fever.   You pass blood-tinged mucus.   You have vaginal bleeding.   You have continuous abdominal pain.   You have low back pain that you never had before.   You feel your baby's head pushing down and causing pelvic pressure.   Your baby is not moving as much as it used to.  Document Released: 10/03/2005 Document Revised: 10/08/2013 Document Reviewed: 07/15/2013 [facility] Patient Information 2015 [place], [place]. This information is not intended to replace advice given to you by your health care provider. Make sure you discuss any questions you have with your health care provider.

## 2014-04-13 NOTE — MAU Note (Signed)
Pt presents to MAU with complaints of contractions that have been getting stronger over the last couple of hours. States she was evaluated earlier this morning and sent home around 2am. States some LOF.

## 2014-04-14 NOTE — Anesthesia Postprocedure Evaluation (Signed)
Anesthesia Post Note  Patient: Bonnie Atkins  Procedure(s) Performed: * No procedures listed *  Anesthesia type: Epidural  Patient location: Mother/Baby  Post pain: Pain level controlled  Post assessment: Post-op Vital signs reviewed  Last Vitals:  Filed Vitals:   04/14/14 1019  BP: 103/57  Pulse: 93  Temp: 37.1 C  Resp:     Post vital signs: Reviewed  Level of consciousness: awake  Complications: No apparent anesthesia complications

## 2014-04-14 NOTE — Addendum Note (Signed)
Addendum created 04/14/14 1607 by Flossie Dibble, CRNA   Modules edited: Charges VN, Notes Section   Notes Section:  File: [id number]

## 2014-04-14 NOTE — Progress Notes (Signed)
Post Partum Day 1 Subjective: no complaints, up ad lib, voiding and tolerating PO  Objective: Blood pressure 112/64, pulse 108, temperature 98.2 F (36.8 C), temperature source Oral, resp. rate 16, height 5\' 4"  (1.626 m), weight 73.483 kg (162 lb), SpO2 97.00%, unknown if currently breastfeeding.  Physical Exam:  General: alert, cooperative and appears stated age Lochia: appropriate Uterine Fundus: firm  Recent Labs  04/13/14 1100  HGB 13.0  HCT 38.2    Assessment/Plan: Plan for discharge tomorrow and Breastfeeding   LOS: 1 day   ROSS,KENDRA H. 04/14/2014, 10:07 AM

## 2014-04-14 NOTE — Anesthesia Postprocedure Evaluation (Signed)
Anesthesia Post Note  Patient: Bonnie Atkins  Procedure(s) Performed: * No procedures listed *  Anesthesia type: Epidural  Patient location: Mother/Baby  Post pain: Pain level controlled  Post assessment: Post-op Vital signs reviewed  Last Vitals:  Filed Vitals:   04/14/14 1019  BP: 103/57  Pulse: 93  Temp: 37.1 C  Resp:     Post vital signs: Reviewed  Level of consciousness:alert  Complications: No apparent anesthesia complications

## 2014-04-14 NOTE — Lactation Note (Signed)
This note was copied from the chart of Bonnie Atkins. Lactation Consultation Note  Patient Name: Bonnie Atkins [id number] Date: 04/14/2014 Reason for consult: Initial assessment RN called for LC to assist with latch. Mom has not been able to get baby to latch or sustain a latch. Mom's nipples are flat, some aerola edema. Will be come erect with stimulation but has a short shaft. Baby has high palate and short posterior frenulum limiting tongue extension, some dimpling noted in the end of the tongue. Baby bites with suck exam and thrusts her tongue. After several attempts, baby did latch to Mom's left breast in cross cradle. She demonstrated a good rhythmic suck with some swallows noted. Baby could not obtain a latch on the right breast. LC had Mom pre-pump and Mom is able to demonstrate breast compression, however baby could not organize her suck on the right. Discussed with Mom that we may need nipple shield to help with latch on the right. At this time company has come to visit. Mom wants to continue to work with latch using techniques learned at this visit. Mom is encouraged baby had good feeding on the left. Basic teaching reviewed. Encouraged STS when awake. Lactation brochure left for review, advised of OP services and support group. Advised Mom to call for assist as needed. LC advised RN if baby not latching, Mom will need nipple shield and DEBP set up.  F/u by [facility] tomorrow.   Maternal Data Formula Feeding for Exclusion: No Infant to breast within first hour of birth: Yes Has patient been taught Hand Expression?: Yes Does the patient have breastfeeding experience prior to this delivery?: No  Feeding Feeding Type: Breast Fed Length of feed: 20 min  LATCH Score/Interventions Latch: Repeated attempts needed to sustain latch, nipple held in mouth throughout feeding, stimulation needed to elicit sucking reflex. Intervention(s): Adjust position;Assist with latch;Breast massage;Breast  compression  Audible Swallowing: A few with stimulation  Type of Nipple: Flat Intervention(s): Hand pump  Comfort (Breast/Nipple): Soft / non-tender     Hold (Positioning): Assistance needed to correctly position infant at breast and maintain latch.  LATCH Score: 6  Lactation Tools Discussed/Used Tools: Pump Breast pump type: Manual WIC Program: No   Consult Status Consult Status: Follow-up Date: 04/15/14 Follow-up type: In-patient    Buffey, Zabinski 04/14/2014, 5:08 PM

## 2014-04-15 NOTE — Lactation Note (Signed)
This note was copied from the chart of Bonnie Atkins. Lactation Consultation Note  Patient Name: Bonnie Ameshia Pewitt [id number] Date: 04/15/2014 Reason for consult: Follow-up assessment;Difficult latch Mom reports to [facility] that she has decided to pump and bottle feed. She has DEBP at home. LC advised to pump every 3 hours for 15-20 minutes to encourage milk production, prevent engorgement and protect milk supply. Guidelines given for supplementing while working on bringing milk in. Engorgement care reviewed if needed. Mom aware of OP services and support group. Info given on Fenugreek supplements.   Maternal Data    Feeding    LATCH Score/Interventions                      Lactation Tools Discussed/Used Tools: Pump;Nipple Shields Breast pump type: Manual   Consult Status Consult Status: Complete Date: 04/15/14 Follow-up type: In-patient    Franci, Oshana 04/15/2014, 12:23 PM

## 2014-04-15 NOTE — Lactation Note (Signed)
This note was copied from the chart of Girl Vasiliki Smaldone. Lactation Consultation Note  Patient Name: Girl Kaylena Pacifico [id number] Date: 04/15/2014 Reason for consult: Follow-up assessment;Difficult latch Mom decided to start supplementing because baby was not satisfied at the breast and is having difficulty latching to the right breast. Mom felt baby was not getting enough at the breast. She reports her Mom and sisters were not able to breastfeed and she was concerned. Mom has DEBP at home and is considering pump and bottle feeding. LC reported to Mom that she would need to pump every 3 hours for 15-20 minutes to encourage milk production and protect milk supply. Mom has tried nipple shield to help with latch on right breast. Mom reports baby will suckle as long as they pre-fill the nipple shield but then becomes fussy and this was another reason she decided to start supplementing. LC offered to assist Mom at the next feeding with the nipple shield and to demonstrate an SNS to keep baby at the breast. Discussed feeding options with Mom to consider. Encouraged to call [facility] with next feeding before d/c for help with feeding plan.   Maternal Data    Feeding Feeding Type: Bottle Fed - Formula Length of feed: 30 min  LATCH Score/Interventions                      Lactation Tools Discussed/Used Tools: Pump;Nipple Shields Breast pump type: Manual   Consult Status Consult Status: Follow-up Date: 04/15/14 Follow-up type: In-patient    Talon, Witting 04/15/2014, 9:02 AM

## 2014-04-15 NOTE — Discharge Summary (Signed)
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  Discharge Diagnoses: Term Pregnancy-delivered  Discharge Information: Date: 04/15/2014 Activity: pelvic rest Diet: routine Medications: PNV and Ibuprofen Condition: stable Instructions: refer to practice specific booklet Discharge to: home Follow-up Information   Follow up with Olga Millers, MD In 4 weeks.   Specialty:  Obstetrics and Gynecology   Contact information:   [place] [id number] [phone]       Newborn Data: Live born female  Birth Weight: 7 lb 7.6 oz (3390 g) APGAR: 8, 9  Home with mother.  Allyn Kenner 04/15/2014, 1:08 PM

## 2014-08-18 ENCOUNTER — Encounter (HOSPITAL_COMMUNITY): Payer: Self-pay | Admitting: *Deleted

## 2017-02-03 DIAGNOSIS — E041 Nontoxic single thyroid nodule: Secondary | ICD-10-CM | POA: Insufficient documentation

## 2017-07-07 DIAGNOSIS — E079 Disorder of thyroid, unspecified: Secondary | ICD-10-CM | POA: Insufficient documentation

## 2017-07-07 DIAGNOSIS — T508X5A Adverse effect of diagnostic agents, initial encounter: Secondary | ICD-10-CM | POA: Insufficient documentation

## 2017-07-07 DIAGNOSIS — Z91013 Allergy to seafood: Secondary | ICD-10-CM | POA: Insufficient documentation

## 2017-07-07 DIAGNOSIS — R1310 Dysphagia, unspecified: Secondary | ICD-10-CM | POA: Insufficient documentation

## 2017-10-17 HISTORY — PX: OTHER SURGICAL HISTORY: SHX169

## 2017-10-31 DIAGNOSIS — J302 Other seasonal allergic rhinitis: Secondary | ICD-10-CM | POA: Insufficient documentation

## 2018-01-02 ENCOUNTER — Other Ambulatory Visit: Payer: Self-pay | Admitting: Obstetrics and Gynecology

## 2018-01-02 DIAGNOSIS — N644 Mastodynia: Secondary | ICD-10-CM

## 2018-01-05 ENCOUNTER — Ambulatory Visit
Admission: RE | Admit: 2018-01-05 | Discharge: 2018-01-05 | Disposition: A | Discharge status: Home / Self Care | Payer: 59 | Source: Ambulatory Visit | Attending: Obstetrics and Gynecology | Admitting: Obstetrics and Gynecology

## 2018-01-05 ENCOUNTER — Other Ambulatory Visit: Payer: Self-pay | Admitting: Obstetrics and Gynecology

## 2018-01-05 ENCOUNTER — Ambulatory Visit
Admission: RE | Admit: 2018-01-05 | Discharge: 2018-01-05 | Disposition: A | Discharge status: Home / Self Care | Payer: Managed Care, Other (non HMO) | Source: Ambulatory Visit | Attending: Obstetrics and Gynecology | Admitting: Obstetrics and Gynecology

## 2018-01-05 DIAGNOSIS — N6320 Unspecified lump in the left breast, unspecified quadrant: Secondary | ICD-10-CM

## 2018-01-05 DIAGNOSIS — N644 Mastodynia: Secondary | ICD-10-CM

## 2018-02-08 DIAGNOSIS — E890 Postprocedural hypothyroidism: Secondary | ICD-10-CM | POA: Insufficient documentation

## 2018-02-08 DIAGNOSIS — Z9009 Acquired absence of other part of head and neck: Secondary | ICD-10-CM | POA: Insufficient documentation

## 2018-07-09 ENCOUNTER — Other Ambulatory Visit: Payer: Self-pay | Admitting: Obstetrics and Gynecology

## 2018-07-09 ENCOUNTER — Ambulatory Visit
Admission: RE | Admit: 2018-07-09 | Discharge: 2018-07-09 | Disposition: A | Discharge status: Home / Self Care | Payer: 59 | Source: Ambulatory Visit | Attending: Obstetrics and Gynecology | Admitting: Obstetrics and Gynecology

## 2018-07-09 DIAGNOSIS — N6320 Unspecified lump in the left breast, unspecified quadrant: Secondary | ICD-10-CM

## 2019-01-08 ENCOUNTER — Other Ambulatory Visit: Payer: 59

## 2019-01-09 ENCOUNTER — Other Ambulatory Visit: Payer: Self-pay

## 2019-01-09 ENCOUNTER — Ambulatory Visit
Admission: RE | Admit: 2019-01-09 | Discharge: 2019-01-09 | Disposition: A | Discharge status: Home / Self Care | Payer: 59 | Source: Ambulatory Visit | Attending: Obstetrics and Gynecology | Admitting: Obstetrics and Gynecology

## 2019-01-09 DIAGNOSIS — N6320 Unspecified lump in the left breast, unspecified quadrant: Secondary | ICD-10-CM

## 2019-10-05 ENCOUNTER — Emergency Department (INDEPENDENT_AMBULATORY_CARE_PROVIDER_SITE_OTHER): Payer: 59

## 2019-10-05 ENCOUNTER — Emergency Department
Admission: EM | Admit: 2019-10-05 | Discharge: 2019-10-05 | Disposition: A | Discharge status: Home / Self Care | Payer: 59 | Source: Home / Self Care | Attending: Family Medicine | Admitting: Family Medicine

## 2019-10-05 ENCOUNTER — Other Ambulatory Visit: Payer: Self-pay

## 2019-10-05 DIAGNOSIS — R05 Cough: Secondary | ICD-10-CM

## 2019-10-05 DIAGNOSIS — J069 Acute upper respiratory infection, unspecified: Secondary | ICD-10-CM

## 2019-10-05 MED ORDER — ALBUTEROL SULFATE HFA 108 (90 BASE) MCG/ACT IN AERS: 2.0000 | INHALATION_SPRAY | Freq: Four times a day (QID) | RESPIRATORY_TRACT | 0 refills | Status: DC | PRN

## 2019-10-05 MED ORDER — PREDNISONE 20 MG PO TABS: ORAL_TABLET | ORAL | 0 refills | Status: DC

## 2019-10-05 NOTE — ED Provider Notes (Signed)
[name]    CSN: [id number] Arrival date & time: 10/05/19  1355      History   Chief Complaint Chief Complaint  Patient presents with  . Cough    HPI Bonnie Atkins is a 38 y.o. female.   Patient complains of a non-productive and cough and mild congestion for six days, but feels well otherwise.  She has seasonal allergies and history  Of exercise asthma.   She denies chest tightness, shortness of breath, and changes in taste/smell.    The history is provided by the patient.    Past Medical History:  Diagnosis Date  . Asthma   . Constipation     Patient Active Problem List   Diagnosis Date Noted  . Active labor at term 04/13/2014  . Supervision of other normal pregnancy 04/13/2014    Past Surgical History:  Procedure Laterality Date  . BREAST EXCISIONAL BIOPSY Right    benign 20 years ago  . BREAST MASS EXCISION     Benign  . WISDOM TOOTH EXTRACTION      OB History    Gravida  1   Para  1   Term  1   Preterm      AB      Living  1     SAB      TAB      Ectopic      Multiple      Live Births  1            Home Medications    Prior to Admission medications   Medication Sig Start Date End Date Taking? Authorizing Provider  albuterol (VENTOLIN HFA) 108 (90 Base) MCG/ACT inhaler Inhale 2 puffs into the lungs every 6 (six) hours as needed for wheezing or shortness of breath. 10/05/19   Kandra Nicolas, MD  loratadine (CLARITIN) 10 MG tablet Take 10 mg by mouth daily.    [provider]  predniSONE (DELTASONE) 20 MG tablet Take one tab by mouth twice daily for 4 days, then one daily. Take with food. 10/05/19   Kandra Nicolas, MD  Prenatal Vit-Fe Fumarate-FA (PRENATAL MULTIVITAMIN) TABS tablet Take 1 tablet by mouth daily at 12 noon.    [provider]    Family History Family History  Problem Relation Age of Onset  . Breast cancer Maternal Grandmother        unsure of age    Social History Social  History   Tobacco Use  . Smoking status: Never Smoker  . Smokeless tobacco: Never Used  Substance Use Topics  . Alcohol use: No  . Drug use: No     Allergies   Erythromycin, Sulfa antibiotics, Codeine, and Fish-derived products   Review of Systems Review of Systems No sore throat + cough No pleuritic pain No wheezing + nasal congestion No post-nasal drainage No sinus pain/pressure No itchy/red eyes No earache No hemoptysis No SOB No fever/chills No nausea No vomiting No abdominal pain No diarrhea No urinary symptoms No skin rash No fatigue No myalgias No headache   Physical Exam Triage Vital Signs ED Triage Vitals  Enc Vitals Group     BP --      Pulse Rate 10/05/19 1605 71     Resp 10/05/19 1605 18     Temp 10/05/19 1605 98.3 F (36.8 C)     Temp Source 10/05/19 1605 Oral     SpO2 --      Weight --  Height --      Head Circumference --      Peak Flow --      Pain Score 10/05/19 1606 0     Pain Loc --      Pain Edu? --      Excl. in [place]? --    No data found.  Updated Vital Signs Pulse 71   Temp 98.3 F (36.8 C) (Oral)   Resp 18   LMP 09/23/2019   Visual Acuity Right Eye Distance:   Left Eye Distance:   Bilateral Distance:    Right Eye Near:   Left Eye Near:    Bilateral Near:     Physical Exam Nursing notes and Vital Signs reviewed. Appearance:  Patient appears stated age, and in no acute distress Eyes:  Pupils are equal, round, and reactive to light and accomodation.  Extraocular movement is intact.  Conjunctivae are not inflamed  Ears:  Canals normal.  Tympanic membranes normal.  Nose:  Mildly congested turbinates.  No sinus tenderness. Pharynx:  Normal Neck:  Supple.  Shotty nontender lateral nodes   Lungs:  Clear to auscultation.  Breath sounds are equal.  Moving air well. Heart:  Regular rate and rhythm without murmurs, rubs, or gallops.  Abdomen:  Nontender without masses or hepatosplenomegaly.  Bowel sounds are present.   No CVA or flank tenderness.  Extremities:  No edema.  Skin:  No rash present.   UC Treatments / Results  Labs (all labs ordered are listed, but only abnormal results are displayed) Labs Reviewed - No data to display  EKG   Radiology DG Chest 2 View  Result Date: 10/05/2019 CLINICAL DATA:  Cough.  Exercise-induced asthma. EXAM: CHEST - 2 VIEW COMPARISON:  None. FINDINGS: The heart size and mediastinal contours are within normal limits. Both lungs are clear. The visualized skeletal structures are unremarkable. IMPRESSION: No active cardiopulmonary disease. Electronically Signed   By: Dorise Bullion III M.D   On: 10/05/2019 16:14    Procedures Procedures (including critical care time)  Medications Ordered in UC Medications - No data to display  Initial Impression / Assessment and Plan / UC Course  I have reviewed the triage vital signs and the nursing notes.  Pertinent labs & imaging results that were available during my care of the patient were reviewed by me and considered in my medical decision making (see chart for details).    Negative chest X-ray reassuring. There is no evidence of bacterial infection today.  Begin prednisone burst/taper. Refill albuterol MDI. Follow-up with PCP if not improved   Final Clinical Impressions(s) / UC Diagnoses   Final diagnoses:  Viral URI with cough     Discharge Instructions     Take plain guaifenesin (1200mg  extended release tabs such as Mucinex) twice daily, with plenty of water, for cough and congestion. Get adequate rest.   May take Delsym Cough Suppressant at bedtime for nighttime cough.  Stop all antihistamines for now, and other non-prescription cough/cold preparations. Continue albuterol inhaler as needed.      ED Prescriptions    Medication Sig Dispense Auth. Provider   predniSONE (DELTASONE) 20 MG tablet Take one tab by mouth twice daily for 4 days, then one daily. Take with food. 12 tablet Kandra Nicolas, MD    albuterol (VENTOLIN HFA) 108 (90 Base) MCG/ACT inhaler Inhale 2 puffs into the lungs every 6 (six) hours as needed for wheezing or shortness of breath. 18 g Kandra Nicolas, MD  Kandra Nicolas, MD 10/14/19 [phone]

## 2019-10-05 NOTE — Discharge Instructions (Addendum)
Take plain guaifenesin (1200mg  extended release tabs such as Mucinex) twice daily, with plenty of water, for cough and congestion. Get adequate rest.   May take Delsym Cough Suppressant at bedtime for nighttime cough.  Stop all antihistamines for now, and other non-prescription cough/cold preparations. Continue albuterol inhaler as needed.

## 2019-10-05 NOTE — ED Triage Notes (Signed)
Pt c/o cough x 6 days. Hx of exercise induced asthma. Daughter recently has bronchitis. Taking delsym and mucinex prn.

## 2020-01-27 ENCOUNTER — Other Ambulatory Visit: Payer: Self-pay | Admitting: Obstetrics and Gynecology

## 2020-01-27 DIAGNOSIS — R928 Other abnormal and inconclusive findings on diagnostic imaging of breast: Secondary | ICD-10-CM

## 2020-01-27 DIAGNOSIS — N6320 Unspecified lump in the left breast, unspecified quadrant: Secondary | ICD-10-CM

## 2020-03-07 IMAGING — MG DIGITAL DIAGNOSTIC BILATERAL MAMMOGRAM WITH TOMO AND CAD
8 series · 8 of 24 positions shown · non-contrast
Comparison: None

CLINICAL DATA: 37-year-old female with diffuse RIGHT breast pain.
Baseline mammogram.

EXAM:
DIGITAL DIAGNOSTIC BILATERAL MAMMOGRAM WITH CAD AND TOMO
ULTRASOUND BILATERAL BREAST

[R MLO synth-2D]
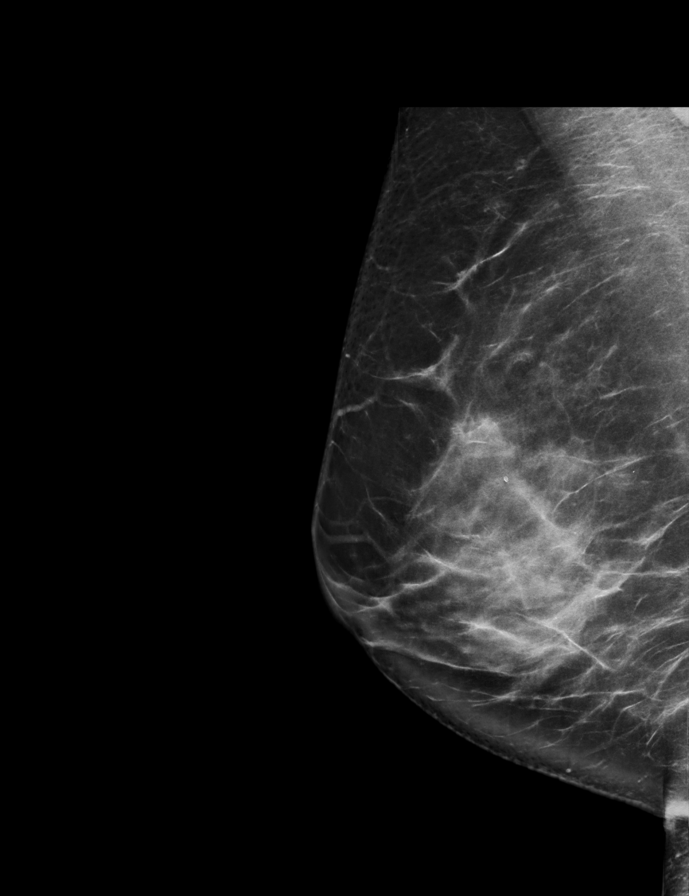

[R CC synth-2D]
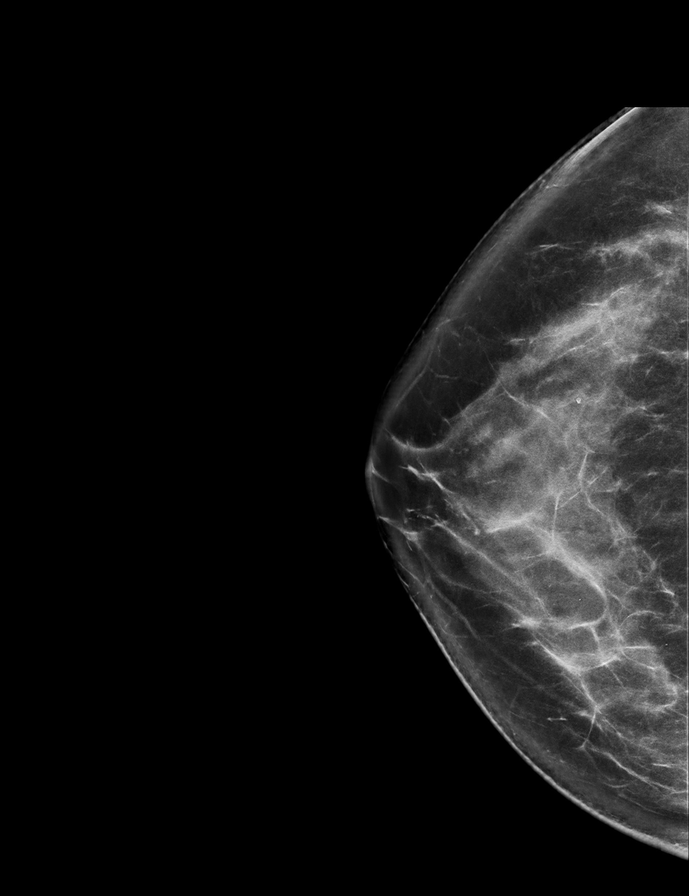

[L MLO synth-2D]
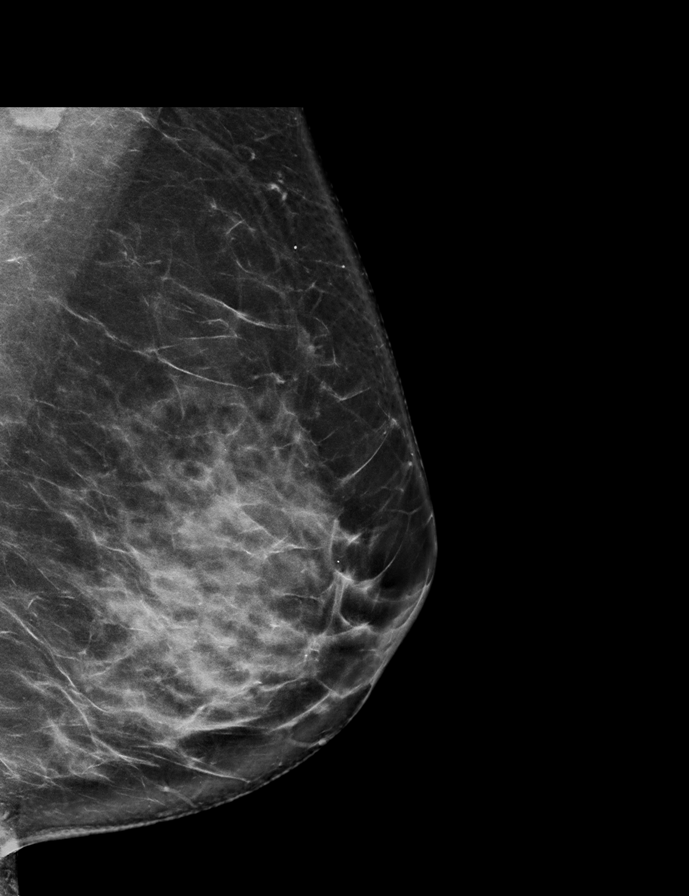

[L CC synth-2D]
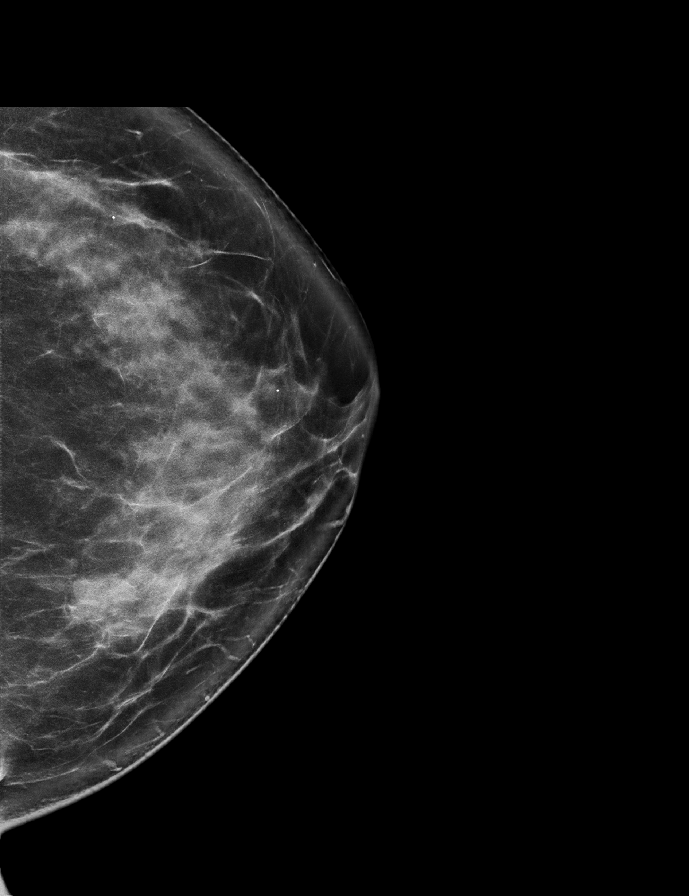

[L CC tomo · tomo slice 43/85.0]
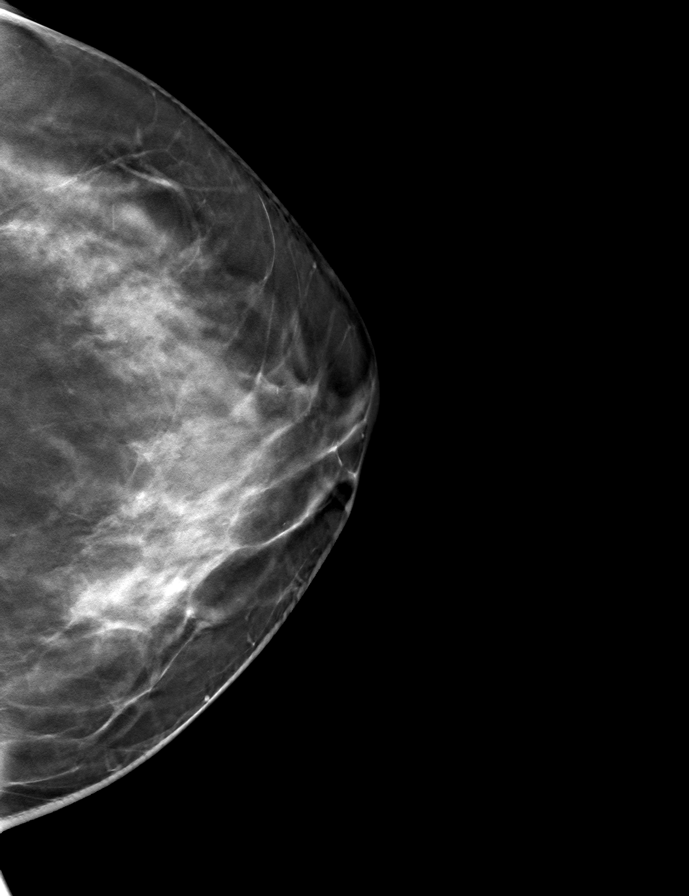

[R CC tomo · tomo slice 43/86.0]
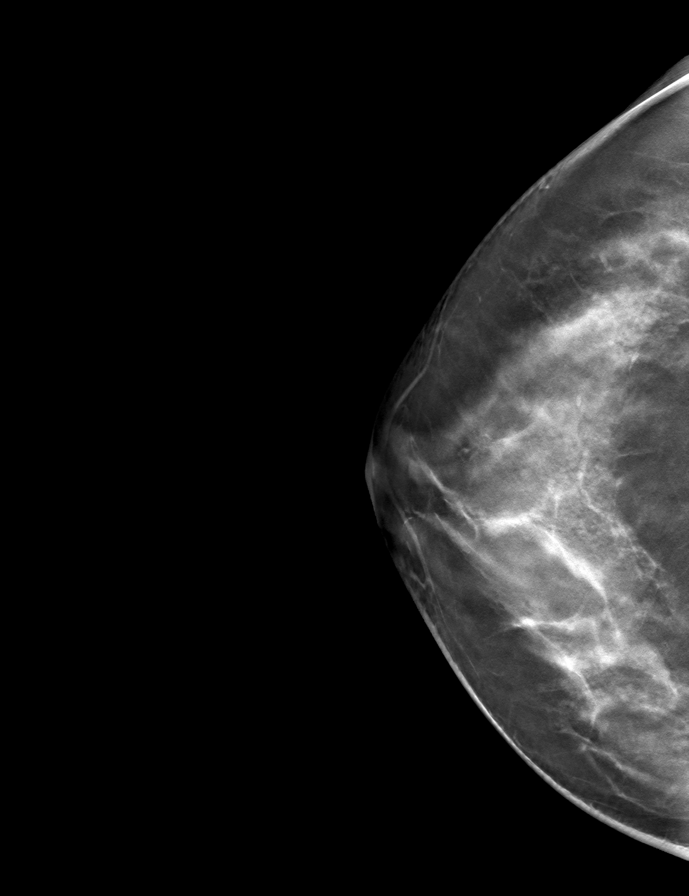

[L MLO tomo · tomo slice 46/91.0]
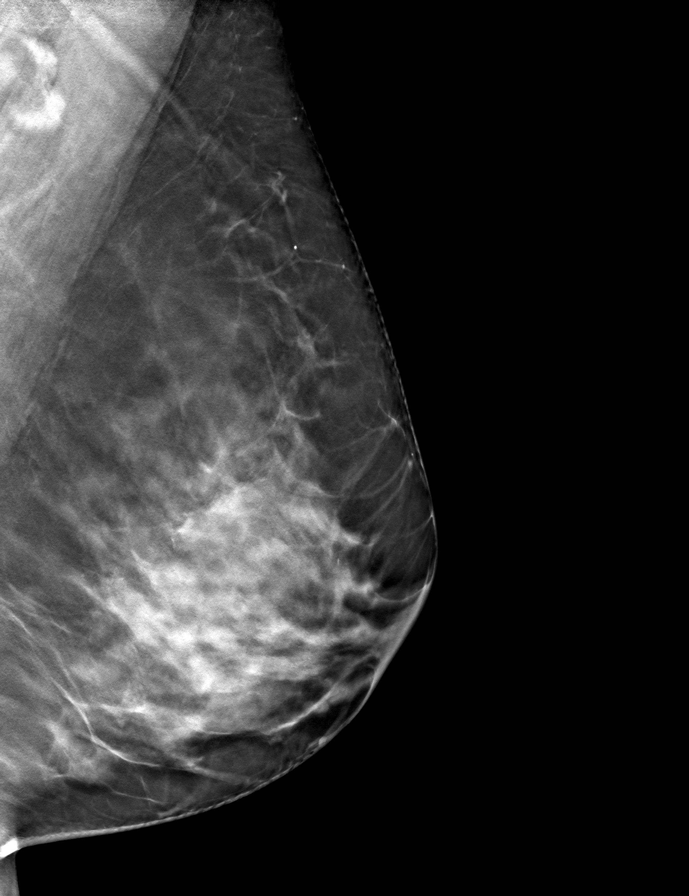

[R MLO tomo · tomo slice 48/95.0]
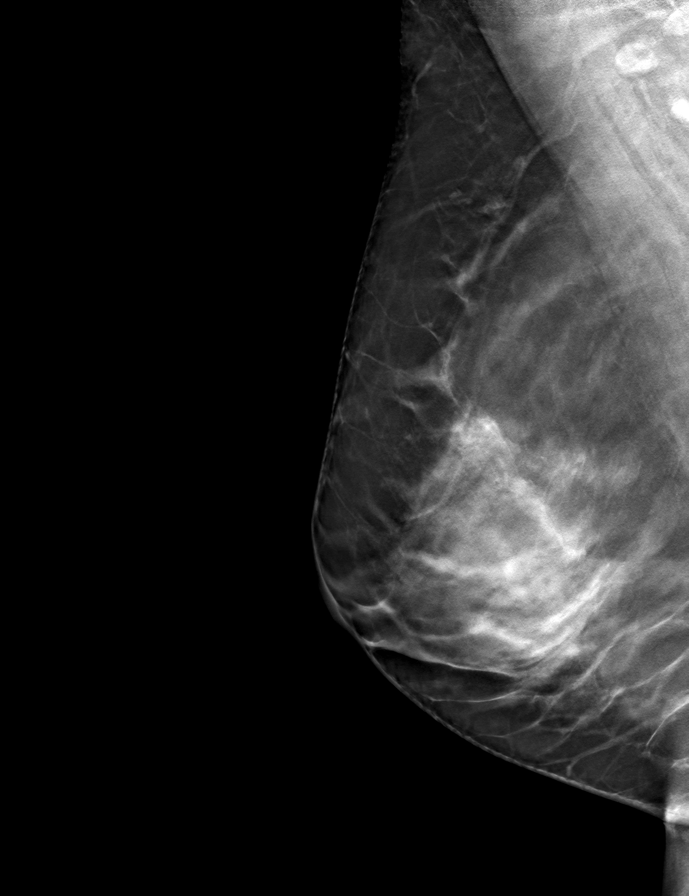

[8 of 24 positions shown; findings below may reference images not displayed]

ACR Breast Density Category c: The breast tissue is heterogeneously
dense, which may obscure small masses.
FINDINGS: 2D and 3D full field views of both breast demonstrate a primarily
circumscribed oval mass within the LOWER INNER LEFT breast probably
containing fat.

No other abnormalities within either breast noted.

Mammographic images were processed with CAD.

Targeted ultrasound is performed, showing 1.7 x 0.9 x 1.4 cm
primarily hyperechoic circumscribed oval mass at 7 o'clock position
of the LEFT breast 6 cm from the nipple, likely benign and may
represent a hamartoma.

A 0.6 x 0.4 x 0.6 cm area of fibrocystic changes within the RIGHT
breast at the 11 o'clock position 3 cm from the nipple is noted. No
other RIGHT breast abnormalities identified.
IMPRESSION: 1. 1.7 cm likely benign mass within the LOWER INNER LEFT breast,
probably a hamartoma. We discussed management options including
ultrasound-guided core biopsy and close follow-up. Follow-up
ultrasound is recommended at 6, 12, and 24 months to assess
stability. The patient concurs with this plan.
2. Very small area of benign fibrocystic changes within the
UPPER-OUTER RIGHT breast.
3. No suspicious abnormalities identified.

RECOMMENDATION:
LEFT breast ultrasound in 6 months.

I have discussed the findings, causes of breast pain and
recommendations (vitamin E supplementation) with the patient.
Results were also provided in writing at the conclusion of the
visit. If applicable, a reminder letter will be sent to the patient
regarding the next appointment.

BI-RADS CATEGORY  3: Probably benign.

## 2020-03-30 DIAGNOSIS — M25511 Pain in right shoulder: Secondary | ICD-10-CM | POA: Insufficient documentation

## 2020-04-21 ENCOUNTER — Other Ambulatory Visit: Payer: Self-pay | Admitting: Obstetrics and Gynecology

## 2020-04-21 DIAGNOSIS — N6320 Unspecified lump in the left breast, unspecified quadrant: Secondary | ICD-10-CM

## 2020-04-21 DIAGNOSIS — R928 Other abnormal and inconclusive findings on diagnostic imaging of breast: Secondary | ICD-10-CM

## 2020-05-07 ENCOUNTER — Other Ambulatory Visit: Payer: Self-pay

## 2020-05-07 ENCOUNTER — Ambulatory Visit
Admission: RE | Admit: 2020-05-07 | Discharge: 2020-05-07 | Disposition: A | Discharge status: Home / Self Care | Payer: 59 | Source: Ambulatory Visit | Attending: Obstetrics and Gynecology | Admitting: Obstetrics and Gynecology

## 2020-05-07 DIAGNOSIS — R928 Other abnormal and inconclusive findings on diagnostic imaging of breast: Secondary | ICD-10-CM

## 2020-05-07 DIAGNOSIS — N6320 Unspecified lump in the left breast, unspecified quadrant: Secondary | ICD-10-CM

## 2020-05-15 DIAGNOSIS — M7531 Calcific tendinitis of right shoulder: Secondary | ICD-10-CM | POA: Insufficient documentation

## 2020-05-17 ENCOUNTER — Ambulatory Visit: Payer: Self-pay

## 2020-05-18 ENCOUNTER — Encounter: Payer: Self-pay | Admitting: Emergency Medicine

## 2020-05-18 ENCOUNTER — Emergency Department (INDEPENDENT_AMBULATORY_CARE_PROVIDER_SITE_OTHER)
Admission: EM | Admit: 2020-05-18 | Discharge: 2020-05-18 | Disposition: A | Discharge status: Home / Self Care | Payer: 59 | Source: Home / Self Care

## 2020-05-18 ENCOUNTER — Other Ambulatory Visit: Payer: Self-pay

## 2020-05-18 DIAGNOSIS — J0100 Acute maxillary sinusitis, unspecified: Secondary | ICD-10-CM | POA: Diagnosis not present

## 2020-05-18 DIAGNOSIS — J069 Acute upper respiratory infection, unspecified: Secondary | ICD-10-CM

## 2020-05-18 DIAGNOSIS — R590 Localized enlarged lymph nodes: Secondary | ICD-10-CM

## 2020-05-18 MED ORDER — AMOXICILLIN-POT CLAVULANATE 875-125 MG PO TABS: 1.0000 | ORAL_TABLET | Freq: Two times a day (BID) | ORAL | 0 refills | Status: DC

## 2020-05-18 NOTE — Discharge Instructions (Signed)

## 2020-05-18 NOTE — ED Triage Notes (Signed)
2 weeks of sinus pressure -pt assumed it was related to her allergies  Fever on Saturday was 102.9 - afebrile  Today at 1100 Tylenol at 1000 today  Pt had a rapid COVID done yesterday - Negative Unknown if a send out was done  No COVID vaccine

## 2020-05-18 NOTE — ED Provider Notes (Signed)
[name]    CSN: [id number] Arrival date & time: 05/18/20  1839      History   Chief Complaint Chief Complaint  Patient presents with  . Facial Pain  . Fever  . Sore Throat    HPI Bonnie Atkins is a 39 y.o. female.   HPI  Bonnie Atkins is a 39 y.o. female presenting to UC with c/o 2 weeks of gradually worsening sinus congestion with sinus pressure.  She developed a fever of 102.9*F that resolved earlier today.  She also reports swollen tender lymph nodes towards the back of her head. Denies neck pain or stiffness. No dizziness, n/v/d.  She did a rapid Covid test yesterday, Negative.  She has not received the Covid vaccine but states she has had sinus infections in the past, symptoms feel similar. No known sick contacts or recent travel.     Past Medical History:  Diagnosis Date  . Asthma   . Constipation     Patient Active Problem List   Diagnosis Date Noted  . Calcific tendinitis of right shoulder region 05/15/2020  . Right shoulder pain 03/30/2020  . 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 Seasonal allergies 10/31/2017  . Allergic reaction to contrast dye 07/07/2017  . Disease of thyroid gland 07/07/2017  . Dysphagia 07/07/2017  . Fish allergy 07/07/2017  . Left thyroid nodule 02/03/2017  . Active labor at term 04/13/2014  . Supervision of other normal pregnancy 04/13/2014  . Asthma 03/23/2013    Past Surgical History:  Procedure Laterality Date  . BREAST EXCISIONAL BIOPSY Right    benign 20 years ago  . BREAST MASS EXCISION     Benign  . WISDOM TOOTH EXTRACTION      OB History    Gravida  1   Para  1   Term  1   Preterm      AB      Living  1     SAB      TAB      Ectopic      Multiple      Live Births  1            Home Medications    Prior to Admission medications   Medication Sig Start Date End Date Taking? Authorizing Provider  albuterol (VENTOLIN HFA) 108 (90 Base) MCG/ACT inhaler Inhale 2  puffs into the lungs every 6 (six) hours as needed for wheezing or shortness of breath. 10/05/19   Kandra Nicolas, MD  amoxicillin-clavulanate (AUGMENTIN) 875-125 MG tablet Take 1 tablet by mouth 2 (two) times daily. One po bid x 7 days 05/18/20   Noe Gens, PA-C  Loratadine (CLARITIN) 10 MG CAPS Claritin  take 1 tablet daily    [provider]  predniSONE (DELTASONE) 20 MG tablet Take one tab by mouth twice daily for 4 days, then one daily. Take with food. 10/05/19   Kandra Nicolas, MD  Prenatal Vit-Fe Fumarate-FA (PRENATAL MULTIVITAMIN) TABS tablet Take 1 tablet by mouth daily at 12 noon.    [provider]    Family History Family History  Problem Relation Age of Onset  . Breast cancer Maternal Grandmother        unsure of age  . Healthy Mother   . Diabetes Father   . Cancer Father     Social History Social History   Tobacco Use  . Smoking status: Never Smoker  . Smokeless tobacco: Never Used  Vaping  Use  . Vaping Use: Never used  Substance Use Topics  . Alcohol use: No  . Drug use: No     Allergies   Erythromycin, Other, Sulfa antibiotics, Codeine, and Fish-derived products   Review of Systems Review of Systems  Constitutional: Negative for chills and fever.  HENT: Positive for congestion, sinus pressure and sinus pain. Negative for ear pain, sore throat, trouble swallowing and voice change.   Respiratory: Negative for cough and shortness of breath.   Cardiovascular: Negative for chest pain and palpitations.  Gastrointestinal: Negative for abdominal pain, diarrhea, nausea and vomiting.  Musculoskeletal: Negative for arthralgias, back pain and myalgias.  Skin: Negative for rash.  Neurological: Positive for headaches (frontal). Negative for dizziness and light-headedness.  All other systems reviewed and are negative.    Physical Exam Triage Vital Signs ED Triage Vitals  Enc Vitals Group     BP 05/18/20 1859 120/83     Pulse Rate  05/18/20 1859 70     Resp 05/18/20 1859 15     Temp 05/18/20 1859 98.8 F (37.1 C)     Temp Source 05/18/20 1859 Oral     SpO2 05/18/20 1859 99 %     Weight --      Height --      Head Circumference --      Peak Flow --      Pain Score 05/18/20 1900 3     Pain Loc --      Pain Edu? --      Excl. in [place]? --    No data found.  Updated Vital Signs BP 120/83 (BP Location: Right Arm)   Pulse 70   Temp 98.8 F (37.1 C) (Oral)   Resp 15   LMP 04/28/2020 (Exact Date)   SpO2 99%   Breastfeeding No   Visual Acuity Right Eye Distance:   Left Eye Distance:   Bilateral Distance:    Right Eye Near:   Left Eye Near:    Bilateral Near:     Physical Exam Vitals and nursing note reviewed.  Constitutional:      General: She is not in acute distress.    Appearance: She is well-developed. She is not ill-appearing, toxic-appearing or diaphoretic.  HENT:     Head: Normocephalic and atraumatic.     Right Ear: Tympanic membrane and ear canal normal.     Left Ear: Tympanic membrane and ear canal normal.     Nose:     Right Sinus: Maxillary sinus tenderness present. No frontal sinus tenderness.     Left Sinus: Maxillary sinus tenderness present. No frontal sinus tenderness.     Mouth/Throat:     Lips: Pink.     Mouth: Mucous membranes are moist.     Pharynx: Oropharynx is clear. Uvula midline.  Neck:   Cardiovascular:     Rate and Rhythm: Normal rate and regular rhythm.  Pulmonary:     Effort: Pulmonary effort is normal. No respiratory distress.     Breath sounds: Normal breath sounds. No stridor. No wheezing, rhonchi or rales.  Musculoskeletal:        General: Normal range of motion.     Cervical back: Normal range of motion and neck supple.  Skin:    General: Skin is warm and dry.  Neurological:     Mental Status: She is alert and oriented to person, place, and time.  Psychiatric:        Behavior: Behavior normal.  UC Treatments / Results  Labs (all labs ordered  are listed, but only abnormal results are displayed) Labs Reviewed - No data to display  EKG   Radiology No results found.  Procedures Procedures (including critical care time)  Medications Ordered in UC Medications - No data to display  Initial Impression / Assessment and Plan / UC Course  I have reviewed the triage vital signs and the nursing notes.  Pertinent labs & imaging results that were available during my care of the patient were reviewed by me and considered in my medical decision making (see chart for details).     2 weeks of worsening sinus congestion, developed into facial pain and fever Rapid Covid test yesterday- Negative Will tx for bacterial sinusitis F/u with PCP later this week if not improving AVS provided.  Final Clinical Impressions(s) / UC Diagnoses   Final diagnoses:  Upper respiratory tract infection, unspecified type  Acute non-recurrent maxillary sinusitis  Occipital lymphadenopathy     Discharge Instructions      Please take antibiotics as prescribed and be sure to complete entire course even if you start to feel better to ensure infection does not come back.  You may take 500mg  acetaminophen every 4-6 hours or in combination with ibuprofen 400-600mg  every 6-8 hours as needed for pain, inflammation, and fever.  Be sure to well hydrated with clear liquids and get at least 8 hours of sleep at night, preferably more while sick.   Please follow up with family medicine in 1 week if needed.     ED Prescriptions    Medication Sig Dispense Auth. Provider   amoxicillin-clavulanate (AUGMENTIN) 875-125 MG tablet Take 1 tablet by mouth 2 (two) times daily. One po bid x 7 days 14 tablet Noe Gens, [place]     PDMP not reviewed this encounter.   Noe Gens, [place] 05/18/20 2053

## 2020-08-17 HISTORY — PX: OTHER SURGICAL HISTORY: SHX169

## 2020-09-16 DIAGNOSIS — U071 COVID-19: Secondary | ICD-10-CM

## 2020-09-16 HISTORY — DX: COVID-19: U07.1

## 2021-01-22 ENCOUNTER — Encounter: Payer: Self-pay | Admitting: Emergency Medicine

## 2021-01-22 ENCOUNTER — Emergency Department (INDEPENDENT_AMBULATORY_CARE_PROVIDER_SITE_OTHER)
Admission: EM | Admit: 2021-01-22 | Discharge: 2021-01-22 | Disposition: A | Discharge status: Home / Self Care | Payer: 59 | Source: Home / Self Care

## 2021-01-22 ENCOUNTER — Other Ambulatory Visit: Payer: Self-pay

## 2021-01-22 ENCOUNTER — Emergency Department: Admit: 2021-01-22 | Discharge status: Absent without leave | Payer: Self-pay

## 2021-01-22 DIAGNOSIS — J40 Bronchitis, not specified as acute or chronic: Secondary | ICD-10-CM

## 2021-01-22 DIAGNOSIS — R059 Cough, unspecified: Secondary | ICD-10-CM

## 2021-01-22 DIAGNOSIS — J209 Acute bronchitis, unspecified: Secondary | ICD-10-CM | POA: Diagnosis not present

## 2021-01-22 HISTORY — DX: Bronchitis, not specified as acute or chronic: J40

## 2021-01-22 MED ORDER — ALBUTEROL SULFATE HFA 108 (90 BASE) MCG/ACT IN AERS: 2.0000 | INHALATION_SPRAY | Freq: Four times a day (QID) | RESPIRATORY_TRACT | 0 refills | Status: DC | PRN

## 2021-01-22 MED ORDER — PREDNISONE 20 MG PO TABS: 40.0000 mg | ORAL_TABLET | Freq: Every day | ORAL | 0 refills | Status: DC

## 2021-01-22 MED ORDER — BENZONATATE 100 MG PO CAPS: 200.0000 mg | ORAL_CAPSULE | Freq: Three times a day (TID) | ORAL | 0 refills | Status: DC | PRN

## 2021-01-22 NOTE — ED Triage Notes (Signed)
Patient presents to Urgent Care with complaints of cough since 8 days ago. Patient reports worsening of cough, chest congestion, runny nose, hoarseness. Denies any fever, chills, headache, sinus pressure or sore throat. Taking Delsym with minor relief

## 2021-01-22 NOTE — ED Provider Notes (Signed)
[name]    CSN: [id number] Arrival date & time: 01/22/21  1031      History   Chief Complaint Chief Complaint  Patient presents with  . Cough    HPI Bonnie Atkins is a 40 y.o. female.   HPI  Patient presents with 8 days of persistent coughing and postnasal drainage.  Patient has a history of exercise-induced asthma with minimal usage of an albuterol inhaler.  She has had some mild wheezing however none to the degree requiring management with her albuterol inhaler.  She also suffers from seasonal allergies.  She has been attempted relief with Delsym without any improvement of symptoms.  Cough has persisted and now she endorses some chest congestion and loss of voice.  Denies any throat pain.  She does not have any nasal drainage.  Afebrile denies any known close contact with anyone who is recently been sick.  Past Medical History:  Diagnosis Date  . Asthma   . Constipation     Patient Active Problem List   Diagnosis Date Noted  . Calcific tendinitis of right shoulder region 05/15/2020  . Right shoulder pain 03/30/2020  . 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 Seasonal allergies 10/31/2017  . Allergic reaction to contrast dye 07/07/2017  . Disease of thyroid gland 07/07/2017  . Dysphagia 07/07/2017  . Fish allergy 07/07/2017  . Left thyroid nodule 02/03/2017  . Active labor at term 04/13/2014  . Supervision of other normal pregnancy 04/13/2014  . Asthma 03/23/2013    Past Surgical History:  Procedure Laterality Date  . BREAST EXCISIONAL BIOPSY Right    benign 20 years ago  . BREAST MASS EXCISION     Benign  . WISDOM TOOTH EXTRACTION      OB History    Gravida  1   Para  1   Term  1   Preterm      AB      Living  1     SAB      IAB      Ectopic      Multiple      Live Births  1            Home Medications    Prior to Admission medications   Medication Sig Start Date End Date Taking? Authorizing Provider   Loratadine 10 MG CAPS Claritin  take 1 tablet daily   Yes [provider]  albuterol (VENTOLIN HFA) 108 (90 Base) MCG/ACT inhaler Inhale 2 puffs into the lungs every 6 (six) hours as needed for wheezing or shortness of breath. 10/05/19   Kandra Nicolas, MD  amoxicillin-clavulanate (AUGMENTIN) 875-125 MG tablet Take 1 tablet by mouth 2 (two) times daily. One po bid x 7 days 05/18/20   Noe Gens, PA-C  predniSONE (DELTASONE) 20 MG tablet Take one tab by mouth twice daily for 4 days, then one daily. Take with food. 10/05/19   Kandra Nicolas, MD  Prenatal Vit-Fe Fumarate-FA (PRENATAL MULTIVITAMIN) TABS tablet Take 1 tablet by mouth daily at 12 noon.    [provider]    Family History Family History  Problem Relation Age of Onset  . Breast cancer Maternal Grandmother        unsure of age  . Healthy Mother   . Diabetes Father   . Cancer Father     Social History Social History   Tobacco Use  . Smoking status: Never Smoker  . Smokeless tobacco: Never Used  Vaping Use  . Vaping Use: Never used  Substance Use Topics  . Alcohol use: No  . Drug use: No     Allergies   Erythromycin, Other, Sulfa antibiotics, Codeine, and Fish-derived products   Review of Systems Review of Systems Pertinent negatives listed in HPI Physical Exam Triage Vital Signs ED Triage Vitals [01/22/21 1043]  Enc Vitals Group     BP 117/79     Pulse Rate 73     Resp 16     Temp 99.2 F (37.3 C)     Temp Source Oral     SpO2 97 %     Weight      Height      Head Circumference      Peak Flow      Pain Score      Pain Loc      Pain Edu?      Excl. in [place]?    No data found.  Updated Vital Signs BP 117/79 (BP Location: Right Arm)   Pulse 73   Temp 99.2 F (37.3 C) (Oral)   Resp 16   SpO2 97%   Visual Acuity Right Eye Distance:   Left Eye Distance:   Bilateral Distance:    Right Eye Near:   Left Eye Near:    Bilateral Near:     Physical Exam General  appearance: alert, Ill-appearing, no distress Head: Normocephalic, without obvious abnormality, atraumatic ENT: External ears normal ,nares congestion w mucosal edema, OP normal  Respiratory: Respirations even , unlabored, coarse lung sound, expiratory wheeze Heart: rate and rhythm normal. No gallop or murmurs noted on exam  Abdomen: BS +, no distention, no rebound tenderness, or no mass Extremities: No gross deformities Skin: Skin color, texture, turgor normal. No rashes seen  Psych: Appropriate mood and affect. Neurologic: GCS 15, normal coordination normal gait  UC Treatments / Results  Labs (all labs ordered are listed, but only abnormal results are displayed) Labs Reviewed - No data to display  EKG   Radiology No results found.  Procedures Procedures (including critical care time)  Medications Ordered in UC Medications - No data to display  Initial Impression / Assessment and Plan / UC Course  I have reviewed the triage vital signs and the nursing notes.  Pertinent labs & imaging results that were available during my care of the patient were reviewed by me and considered in my medical decision making (see chart for details).     Treating for acute bronchitis with prednisone 40 mg once daily with food for total of 5 days.  For cough benzonatate Perles 200 mg 3 times daily as needed.  Refilled albuterol inhaler 2 puffs every 6 hours if needed.  Follow-up with PCP as needed Final Clinical Impressions(s) / UC Diagnoses   Final diagnoses:  Acute bronchitis, unspecified organism  Cough   Discharge Instructions   None    ED Prescriptions    Medication Sig Dispense Auth. Provider   predniSONE (DELTASONE) 20 MG tablet Take 2 tablets (40 mg total) by mouth daily with breakfast. 10 tablet Scot Jun, FNP   benzonatate (TESSALON) 100 MG capsule Take 2 capsules (200 mg total) by mouth 3 (three) times daily as needed for cough. 40 capsule Scot Jun, FNP    albuterol (VENTOLIN HFA) 108 (90 Base) MCG/ACT inhaler Inhale 2 puffs into the lungs every 6 (six) hours as needed for wheezing or shortness of breath. 18 g Scot Jun, FNP  PDMP not reviewed this encounter.   Scot Jun, FNP 01/22/21 1113

## 2021-02-05 ENCOUNTER — Encounter (HOSPITAL_BASED_OUTPATIENT_CLINIC_OR_DEPARTMENT_OTHER): Payer: Self-pay | Admitting: Obstetrics & Gynecology

## 2021-02-05 ENCOUNTER — Other Ambulatory Visit: Payer: Self-pay

## 2021-02-05 NOTE — Progress Notes (Signed)
Spoke w/ via phone for pre-op interview---pt Lab needs dos----   Cbc urine preg, t & s            Lab results------none COVID test ------02-08-2021 1225-- Arrive: 1015 am 02-10-2021 NPO after MN NO Solid Food.  Clear liquids from MN until---915 am then npo Med rec completed Medications to take morning of surgery -----albuterol inhaler prn/bring inhaler Diabetic medication -----n/a Patient instructed to bring photo id and insurance card day of surgery Patient aware to have Driver (ride ) / caregiver  Spouse Eddie Dibbles will stay   for 24 hours after surgery  Patient Special Instructions -----none Pre-Op special Istructions -----none Patient verbalized understanding of instructions that were given at this phone interview. Patient denies shortness of breath, chest pain, fever, cough at this phone interview.

## 2021-02-08 ENCOUNTER — Other Ambulatory Visit (HOSPITAL_COMMUNITY)
Admission: RE | Admit: 2021-02-08 | Discharge: 2021-02-08 | Disposition: A | Discharge status: Home / Self Care | Payer: 59 | Source: Ambulatory Visit | Attending: Obstetrics & Gynecology | Admitting: Obstetrics & Gynecology

## 2021-02-08 DIAGNOSIS — Z20822 Contact with and (suspected) exposure to covid-19: Secondary | ICD-10-CM | POA: Insufficient documentation

## 2021-02-08 DIAGNOSIS — Z01812 Encounter for preprocedural laboratory examination: Secondary | ICD-10-CM | POA: Diagnosis not present

## 2021-02-09 LAB — SARS CORONAVIRUS 2 (TAT 6-24 HRS): SARS Coronavirus 2: NEGATIVE

## 2021-02-09 NOTE — H&P (Signed)
Bonnie Atkins is an 40 y.o. female. Seen by me 01/12/2021 at [facility] for preop: 40yo G1P1 here for GYN [place] and preop visit for diagnostic laparoscopy 02/10/2021 for pelvic pain, concerning for endometriosis.  Pelvic US today due to R>L pelvic pain. Reports that her cramping has been worse since last visit.   As discussed at last visit:  Reports the past year she has been having issues. She saw GI/Urology, and had a negative work up.  Pain has been on the right side mostly, but now also having left side pain for the past month. Having bloating and nausea. Some days pain is bad, doubling over, other days it is just there but she is still able to function. At this point she just wants answers.   Last summer, she thought she had a UTI, found to have hematuria, prompted CT scan.  Has a cystoscopy, voiding cystogram, and colonoscopy.   Menses - are monthly, 1 day of significant bleeding, the rest is lighter, last 5-6 days. Reports more cramping during her period, some days severe nausea during her period. Pain usually worse around period, especially the week before period.   Bowel symptoms: reports pain is unrelated to BMs. Reports no constipation Voiding symptoms: sometimes has pain with voiding/cramping Sex: occasional pain with sex No known pain triggers  No meds currently. Has not tried medical management for endometriosis. Would like to proceed with diagnostic laparoscopy for answers prior to starting medical management.      Past Medical History:  Diagnosis Date  . Asthma    exercise induced asthma  . Bronchitis 01/22/2021   resolved with predisone   . Constipation   . COVID 09/2020   headache, fever fatige x 10 days all symptoms resolved  . Pelvic pain   . Wears glasses     Past Surgical History:  Procedure Laterality Date  . BREAST EXCISIONAL BIOPSY Right    benign 20 years ago  . BREAST MASS EXCISION Right yrs ago   Benign  . partial thryoidectomy Left 2019    cyst removed  . right knee arthroscopic tendon replaced  2019  . right rotator cuff repair  08/2020   [facility]  . WISDOM TOOTH EXTRACTION  age 77    Family History  Problem Relation Age of Onset  . Breast cancer Maternal Grandmother        unsure of age  . Healthy Mother   . Diabetes Father   . Cancer Father     Social History:  reports that she has never smoked. She has never used smokeless tobacco. She reports that she does not drink alcohol and does not use drugs.  Allergies:  Allergies  Allergen Reactions  . Erythromycin Anaphylaxis  . Other Palpitations    Any fish derived products  . Sulfa Antibiotics Nausea And Vomiting  . Codeine Nausea And Vomiting and Rash  . Fish-Derived Products Palpitations    No medications prior to admission.    Review of Systems Per HPI all other pertinent ROS neg  Height 5\' 4"  (1.626 m), weight 65.8 kg, last menstrual period 01/26/2021. Physical Exam  Exam 01/12/2021  Constitutional *General Appearance: healthy-appearing  Head Head: normocephalic  Cardiovascular *Auscultation: RRR, no murmur  Lungs *Respiratory Effort: no accessory muscle usage *Auscultation: clear to auscultation  Exam 12/16/2020 Chaperone Chaperone: present  Constitutional *General Appearance: healthy-appearing  Head Head: normocephalic  Abdomen *Inspection/Palpation/Auscultation: non-distended, soft  Female Genitalia Vulva: no masses, no atrophy, no lesions Mons: normal Labia  Majora: normal Labia Minora: normal Introitus: normal *Vagina: normal, no discharge *Cervix: grossly normal, no lesions, no discharge *Uterus: normal size, normal contour, midline, mobile (mildy tender to palpation) *Adnexa/Parametria: no mass palpable, adnexal tenderness right  Assessment/Plan: 40yo G1P1 here for diagnostic laparoscopy, possible excision of endometriosis -Discussed possibility of endometriosis based on symptoms. Reviewed management  options including hormonal therapy for endometriosis prior to proceeding with surgery. Patient desires diagnostic laparoscopy first. Understands that if endometriosis if found and not controlled with medication that we will discuss hysterectomy in the future but for now proceed only with diagnostic laparoscopy and possible excision of endometriosis. -The nature of the procedure was discussed with the patient in detail. An informed discussion was held regarding the risks and benefits of surgical intervention. Specifically, the patient was apprised of risks of pain, bleeding requiring blood transfusion, infection requiring antibiotics, injury to nearby organs (bowel, bladder, nerves, blood vessels, ureter), or failure to achieve desired results. She was informed of the low but real risk of these complications, and understands that the alternative is no surgery. An opportunity to ask questions was provided, and all questions were answered to the patient's satisfaction. Patient expresses understanding of these issues, and agrees to proceed with the plan outlined above. The expected post-operative recovery course was discussed with the patient, and post-operative instructions were reviewed. -Patient plans to be out of work until Monday after surgery and then will be on Desk duty x4w postop -Postop scheduled 02/24/2021  Tanesia Butner K Taam-Akelman 02/09/2021, 5:02 PM

## 2021-02-10 ENCOUNTER — Encounter (HOSPITAL_BASED_OUTPATIENT_CLINIC_OR_DEPARTMENT_OTHER): Payer: Self-pay | Admitting: Obstetrics & Gynecology

## 2021-02-10 ENCOUNTER — Ambulatory Visit (HOSPITAL_BASED_OUTPATIENT_CLINIC_OR_DEPARTMENT_OTHER): Payer: 59 | Admitting: Anesthesiology

## 2021-02-10 ENCOUNTER — Other Ambulatory Visit: Payer: Self-pay

## 2021-02-10 ENCOUNTER — Encounter (HOSPITAL_BASED_OUTPATIENT_CLINIC_OR_DEPARTMENT_OTHER)
Admission: RE | Disposition: A | Discharge status: Home / Self Care | Payer: Self-pay | Source: Home / Self Care | Attending: Obstetrics & Gynecology

## 2021-02-10 ENCOUNTER — Ambulatory Visit (HOSPITAL_BASED_OUTPATIENT_CLINIC_OR_DEPARTMENT_OTHER)
Admission: RE | Admit: 2021-02-10 | Discharge: 2021-02-10 | Disposition: A | Discharge status: Home / Self Care | Payer: 59 | Attending: Obstetrics & Gynecology | Admitting: Obstetrics & Gynecology

## 2021-02-10 DIAGNOSIS — Z882 Allergy status to sulfonamides status: Secondary | ICD-10-CM | POA: Diagnosis not present

## 2021-02-10 DIAGNOSIS — Z881 Allergy status to other antibiotic agents status: Secondary | ICD-10-CM | POA: Insufficient documentation

## 2021-02-10 DIAGNOSIS — R102 Pelvic and perineal pain: Secondary | ICD-10-CM | POA: Insufficient documentation

## 2021-02-10 DIAGNOSIS — Z885 Allergy status to narcotic agent status: Secondary | ICD-10-CM | POA: Insufficient documentation

## 2021-02-10 HISTORY — DX: Presence of spectacles and contact lenses: Z97.3

## 2021-02-10 HISTORY — DX: Pelvic and perineal pain: R10.2

## 2021-02-10 HISTORY — PX: LAPAROSCOPY: SHX197

## 2021-02-10 LAB — CBC
HCT: 43.1 % (ref 36.0–46.0)
Hemoglobin: 13.9 g/dL (ref 12.0–15.0)
MCH: 28.3 pg (ref 26.0–34.0)
MCHC: 32.3 g/dL (ref 30.0–36.0)
MCV: 87.8 fL (ref 80.0–100.0)
Platelets: 289 10*3/uL (ref 150–400)
RBC: 4.91 MIL/uL (ref 3.87–5.11)
RDW: 12.8 % (ref 11.5–15.5)
WBC: 7.5 10*3/uL (ref 4.0–10.5)
nRBC: 0 % (ref 0.0–0.2)

## 2021-02-10 LAB — TYPE AND SCREEN
ABO/RH(D): A POS
Antibody Screen: NEGATIVE

## 2021-02-10 LAB — POCT PREGNANCY, URINE: Preg Test, Ur: NEGATIVE

## 2021-02-10 SURGERY — LAPAROSCOPY, DIAGNOSTIC
Anesthesia: General | Site: Abdomen

## 2021-02-10 MED ORDER — OXYCODONE-ACETAMINOPHEN 5-325 MG PO TABS: 1.0000 | ORAL_TABLET | Freq: Four times a day (QID) | ORAL | 0 refills | Status: DC | PRN

## 2021-02-10 MED ORDER — DEXAMETHASONE SODIUM PHOSPHATE 4 MG/ML IJ SOLN
INTRAMUSCULAR | Status: DC | PRN
  Administered 2021-02-10: 10 mg via INTRAVENOUS

## 2021-02-10 MED ORDER — LIDOCAINE HCL (CARDIAC) PF 100 MG/5ML IV SOSY
PREFILLED_SYRINGE | INTRAVENOUS | Status: DC | PRN
  Administered 2021-02-10: 60 mg via INTRAVENOUS

## 2021-02-10 MED ORDER — FENTANYL CITRATE (PF) 250 MCG/5ML IJ SOLN
INTRAMUSCULAR | Status: AC
  Filled 2021-02-10: qty 5

## 2021-02-10 MED ORDER — KETOROLAC TROMETHAMINE 30 MG/ML IJ SOLN
INTRAMUSCULAR | Status: DC | PRN
  Administered 2021-02-10: 30 mg via INTRAVENOUS

## 2021-02-10 MED ORDER — ONDANSETRON HCL 4 MG/2ML IJ SOLN
INTRAMUSCULAR | Status: DC | PRN
  Administered 2021-02-10: 4 mg via INTRAVENOUS

## 2021-02-10 MED ORDER — IBUPROFEN 600 MG PO TABS: 600.0000 mg | ORAL_TABLET | Freq: Four times a day (QID) | ORAL | 0 refills | Status: DC | PRN

## 2021-02-10 MED ORDER — LACTATED RINGERS IV SOLN: INTRAVENOUS | Status: DC

## 2021-02-10 MED ORDER — ROCURONIUM BROMIDE 10 MG/ML (PF) SYRINGE
PREFILLED_SYRINGE | INTRAVENOUS | Status: AC
  Filled 2021-02-10: qty 10

## 2021-02-10 MED ORDER — FENTANYL CITRATE (PF) 100 MCG/2ML IJ SOLN
INTRAMUSCULAR | Status: DC | PRN
  Administered 2021-02-10: 100 ug via INTRAVENOUS

## 2021-02-10 MED ORDER — ACETAMINOPHEN 500 MG PO TABS
ORAL_TABLET | ORAL | Status: AC
  Filled 2021-02-10: qty 1

## 2021-02-10 MED ORDER — MIDAZOLAM HCL 5 MG/5ML IJ SOLN
INTRAMUSCULAR | Status: DC | PRN
  Administered 2021-02-10: 2 mg via INTRAVENOUS

## 2021-02-10 MED ORDER — SUGAMMADEX SODIUM 200 MG/2ML IV SOLN
INTRAVENOUS | Status: DC | PRN
  Administered 2021-02-10: 300 mg via INTRAVENOUS

## 2021-02-10 MED ORDER — ONDANSETRON HCL 4 MG/2ML IJ SOLN
INTRAMUSCULAR | Status: AC
  Filled 2021-02-10: qty 2

## 2021-02-10 MED ORDER — ACETAMINOPHEN 500 MG PO TABS
ORAL_TABLET | ORAL | Status: AC
  Filled 2021-02-10: qty 2

## 2021-02-10 MED ORDER — ROCURONIUM BROMIDE 10 MG/ML (PF) SYRINGE
PREFILLED_SYRINGE | INTRAVENOUS | Status: DC | PRN
  Administered 2021-02-10: 60 mg via INTRAVENOUS

## 2021-02-10 MED ORDER — LIDOCAINE 2% (20 MG/ML) 5 ML SYRINGE
INTRAMUSCULAR | Status: AC
  Filled 2021-02-10: qty 5

## 2021-02-10 MED ORDER — DEXAMETHASONE SODIUM PHOSPHATE 10 MG/ML IJ SOLN
INTRAMUSCULAR | Status: AC
  Filled 2021-02-10: qty 1

## 2021-02-10 MED ORDER — PROPOFOL 10 MG/ML IV BOLUS
INTRAVENOUS | Status: DC | PRN
  Administered 2021-02-10: 180 mg via INTRAVENOUS

## 2021-02-10 MED ORDER — SCOPOLAMINE 1 MG/3DAYS TD PT72
MEDICATED_PATCH | TRANSDERMAL | Status: AC
  Filled 2021-02-10: qty 1

## 2021-02-10 MED ORDER — ENSURE PRE-SURGERY PO LIQD: 296.0000 mL | Freq: Once | ORAL | Status: DC

## 2021-02-10 MED ORDER — SCOPOLAMINE 1 MG/3DAYS TD PT72
1.0000 | MEDICATED_PATCH | TRANSDERMAL | Status: DC
  Administered 2021-02-10: 1.5 mg via TRANSDERMAL

## 2021-02-10 MED ORDER — KETOROLAC TROMETHAMINE 15 MG/ML IJ SOLN: 30.0000 mg | INTRAMUSCULAR | Status: DC

## 2021-02-10 MED ORDER — DEXMEDETOMIDINE (PRECEDEX) IN NS 20 MCG/5ML (4 MCG/ML) IV SYRINGE
PREFILLED_SYRINGE | INTRAVENOUS | Status: DC | PRN
  Administered 2021-02-10: 12 ug via INTRAVENOUS

## 2021-02-10 MED ORDER — GABAPENTIN 300 MG PO CAPS
ORAL_CAPSULE | ORAL | Status: AC
  Filled 2021-02-10: qty 1

## 2021-02-10 MED ORDER — BUPIVACAINE HCL (PF) 0.25 % IJ SOLN
INTRAMUSCULAR | Status: DC | PRN
  Administered 2021-02-10: 10 mL

## 2021-02-10 MED ORDER — DEXMEDETOMIDINE (PRECEDEX) IN NS 20 MCG/5ML (4 MCG/ML) IV SYRINGE
PREFILLED_SYRINGE | INTRAVENOUS | Status: AC
  Filled 2021-02-10: qty 5

## 2021-02-10 MED ORDER — GABAPENTIN 300 MG PO CAPS
300.0000 mg | ORAL_CAPSULE | ORAL | Status: AC
  Administered 2021-02-10: 300 mg via ORAL

## 2021-02-10 MED ORDER — MIDAZOLAM HCL 2 MG/2ML IJ SOLN
INTRAMUSCULAR | Status: AC
  Filled 2021-02-10: qty 2

## 2021-02-10 MED ORDER — ACETAMINOPHEN 500 MG PO TABS
1000.0000 mg | ORAL_TABLET | ORAL | Status: AC
  Administered 2021-02-10: 1000 mg via ORAL

## 2021-02-10 MED ORDER — ONDANSETRON 4 MG PO TBDP: 4.0000 mg | ORAL_TABLET | Freq: Three times a day (TID) | ORAL | 0 refills | Status: DC | PRN

## 2021-02-10 SURGICAL SUPPLY — 26 items
ADH SKN CLS APL DERMABOND .7 (GAUZE/BANDAGES/DRESSINGS) ×1
COVER WAND RF STERILE (DRAPES) ×2 IMPLANT
DERMABOND ADVANCED (GAUZE/BANDAGES/DRESSINGS) ×1
DERMABOND ADVANCED .7 DNX12 (GAUZE/BANDAGES/DRESSINGS) ×1 IMPLANT
DRSG COVADERM PLUS 2X2 (GAUZE/BANDAGES/DRESSINGS) ×2 IMPLANT
GAUZE 4X4 16PLY RFD (DISPOSABLE) ×2 IMPLANT
GLOVE SURG ENC MOIS LTX SZ7 (GLOVE) ×2 IMPLANT
GLOVE SURG LTX SZ6 (GLOVE) ×2 IMPLANT
GLOVE SURG UNDER POLY LF SZ6 (GLOVE) ×4 IMPLANT
GLOVE SURG UNDER POLY LF SZ7 (GLOVE) ×2 IMPLANT
GLOVE SURG UNDER POLY LF SZ7.5 (GLOVE) ×2 IMPLANT
GOWN STRL REUS W/TWL LRG LVL3 (GOWN DISPOSABLE) ×4 IMPLANT
KIT TURNOVER CYSTO (KITS) ×2 IMPLANT
NS IRRIG 1000ML POUR BTL (IV SOLUTION) ×2 IMPLANT
PACK LAPAROSCOPY BASIN (CUSTOM PROCEDURE TRAY) ×2 IMPLANT
PACK TRENDGUARD 450 HYBRID PRO (MISCELLANEOUS) ×1 IMPLANT
SET SUCTION IRRIG HYDROSURG (IRRIGATION / IRRIGATOR) ×2 IMPLANT
SET TUBE SMOKE EVAC HIGH FLOW (TUBING) ×2 IMPLANT
SUT MNCRL AB 3-0 PS2 27 (SUTURE) ×2 IMPLANT
SUT VICRYL 0 UR6 27IN ABS (SUTURE) ×2 IMPLANT
TOWEL OR 17X26 10 PK STRL BLUE (TOWEL DISPOSABLE) ×2 IMPLANT
TRAY FOLEY W/BAG SLVR 14FR LF (SET/KITS/TRAYS/PACK) ×2 IMPLANT
TRENDGUARD 450 HYBRID PRO PACK (MISCELLANEOUS) ×2
TROCAR BLADELESS OPT 5 100 (ENDOMECHANICALS) ×4 IMPLANT
TROCAR XCEL NON-BLD 11X100MML (ENDOMECHANICALS) ×2 IMPLANT
WARMER LAPAROSCOPE (MISCELLANEOUS) ×2 IMPLANT

## 2021-02-10 NOTE — Brief Op Note (Signed)
02/10/2021  10:45 AM  PATIENT:  Bonnie Atkins  40 y.o. female  PRE-OPERATIVE DIAGNOSIS:  pelvic pain  POST-OPERATIVE DIAGNOSIS:  pelvic pain  PROCEDURE:  Procedure(s): LAPAROSCOPY DIAGNOSTIC (N/A)  SURGEON:  Surgeon(s) and Role:    * Taam-Akelman, Lawrence Santiago, MD - Primary    * Rowland Lathe, MD - Assisting  ANESTHESIA:   general  EBL:  5 mL   BLOOD ADMINISTERED:none  DRAINS: none   LOCAL MEDICATIONS USED:  MARCAINE     SPECIMEN:  No Specimen  DISPOSITION OF SPECIMEN:  N/A  COUNTS:  YES  TOURNIQUET:  * No tourniquets in log *  DICTATION: .Note written in EPIC  PLAN OF CARE: Discharge to home after PACU  PATIENT DISPOSITION:  PACU - hemodynamically stable.   Delay start of Pharmacological VTE agent (>24hrs) due to surgical blood loss or risk of bleeding: not applicable

## 2021-02-10 NOTE — Anesthesia Postprocedure Evaluation (Signed)
Anesthesia Post Note  Patient: Bonnie Atkins  Procedure(s) Performed: LAPAROSCOPY DIAGNOSTIC (N/A Abdomen)     Patient location during evaluation: PACU Anesthesia Type: General Level of consciousness: awake and alert Pain management: pain level controlled Vital Signs Assessment: post-procedure vital signs reviewed and stable Respiratory status: spontaneous breathing, nonlabored ventilation, respiratory function stable and patient connected to nasal cannula oxygen Cardiovascular status: blood pressure returned to baseline and stable Postop Assessment: no apparent nausea or vomiting Anesthetic complications: no   No complications documented.  Last Vitals:  Vitals:   02/10/21 1130 02/10/21 1132  BP: 93/62 97/61  Pulse: (!) 56 (!) 57  Resp: 13 15  Temp:    SpO2: 95% 97%    Last Pain:  Vitals:   02/10/21 1130  TempSrc:   PainSc: 0-No pain                 Shawndell Varas S

## 2021-02-10 NOTE — Anesthesia Procedure Notes (Signed)
Procedure Name: Intubation Date/Time: 02/10/2021 10:14 AM Performed by: Lieutenant Diego, CRNA Pre-anesthesia Checklist: Patient identified, Emergency Drugs available, Suction available and Patient being monitored Patient Re-evaluated:Patient Re-evaluated prior to induction Oxygen Delivery Method: Circle system utilized Preoxygenation: Pre-oxygenation with 100% oxygen Induction Type: IV induction Ventilation: Mask ventilation without difficulty Laryngoscope Size: Miller and 2 Grade View: Grade I Tube type: Oral Tube size: 7.0 mm Number of attempts: 1 Airway Equipment and Method: Stylet Placement Confirmation: ETT inserted through vocal cords under direct vision,  positive ETCO2 and breath sounds checked- equal and bilateral Secured at: 22 cm Tube secured with: Tape Dental Injury: Teeth and Oropharynx as per pre-operative assessment

## 2021-02-10 NOTE — Transfer of Care (Signed)
Immediate Anesthesia Transfer of Care Note  Patient: Bonnie Atkins  Procedure(s) Performed: LAPAROSCOPY DIAGNOSTIC (N/A Abdomen)  Patient Location: PACU  Anesthesia Type:General  Level of Consciousness: awake  Airway & Oxygen Therapy: Patient Spontanous Breathing and Patient connected to face mask oxygen  Post-op Assessment: Report given to RN and Post -op Vital signs reviewed and stable  Post vital signs: Reviewed and stable  Last Vitals:  Vitals Value Taken Time  BP 100/61 02/10/21 1057  Temp    Pulse 63 02/10/21 1058  Resp 13 02/10/21 1058  SpO2 100 % 02/10/21 1058  Vitals shown include unvalidated device data.  Last Pain:  Vitals:   02/10/21 0933  TempSrc: Oral  PainSc: 0-No pain      Patients Stated Pain Goal: 4 (36/[id number])  Complications: No complications documented.

## 2021-02-10 NOTE — Op Note (Signed)
Operative Note PATIENT:  Bonnie Atkins  40 y.o. female  PRE-OPERATIVE DIAGNOSIS:  pelvic pain  POST-OPERATIVE DIAGNOSIS:  pelvic pain  PROCEDURE:  Procedure(s): LAPAROSCOPY DIAGNOSTIC (N/A)  SURGEON:  Surgeon(s) and Role:    * Taam-Akelman, Lawrence Santiago, MD - Primary    * Rowland Lathe, MD - Assisting  ANESTHESIA:   general  EBL:  5 mL   BLOOD ADMINISTERED:none  DRAINS: none   LOCAL MEDICATIONS USED:  MARCAINE     SPECIMEN:  No Specimen  DISPOSITION OF SPECIMEN:  N/A  COUNTS:  YES  TOURNIQUET:  * No tourniquets in log *  PLAN OF CARE: Discharge to home after PACU  PATIENT DISPOSITION:  PACU - hemodynamically stable.   Delay start of Pharmacological VTE agent (>24hrs) due to surgical blood loss or risk of bleeding: not applicable    Findings: Exam under anesthesia revealed small mobile anteverted uterus. Diagnostic laparoscopy revealed normal ovaries, fallopian tubes and uterus. Normal appearing appendix. The ovaries, bladder, pelvic sidewall, and colon were inspected and noted to not have any endometrial implants.   Description of procedure  After consent was verified, the patient was taken to the operating room where general anesthesia was administered without difficulty. The patient was placed in the dorsal lithotomy position using Allen stirrups.  Exam under anesthesia was performed, revealing a small mobile anteverted uterus. The abdomen and vagina were subsequently prepped and draped in the normal sterile fashion. A foley catheter was placed. A speculum was placed in the patient's vagina and the anterior lip of the cervix was grasped with a single-tooth tenaculum and a hulka manipulator was placed through the os and secured to the anterior lip. The tenaculum and speculum were removed.   Attention was then turned to the patient's abdomen where a 32mm vertical skin incision was made at the base of the umbilicus The abdominal wall was tented and  the 15mm Optiview trochar and sleeve were advanced, where intra-abdominal placement was confirmed by laparoscope. Patient was placed in Trendelenburg. A survey of the patient's pelvis and abdomen revealed: normal appendix, normal upper abdomen, normal ovaries, fallopian tubes and uterus. The ovaries, bladder, pelvic sidewall, and colon were inspected and noted to not have any endometrial implants.   All instruments were removed from the patient's abdomen. The umbilucis fascia was closed with vicryl on a UR 6 in a running fashion. The skin incision were repaired with monocryl. The uterine manipulator was removed and the cervix was hemostatic. The patient tolerated the procedure well and was taken to the recovery room in stable condition. Sponge, lap, and needle counts were correct x3.  [name] 02/10/21 10:54 AM

## 2021-02-10 NOTE — Discharge Instructions (Signed)
Medications Take Motrin (ibuprofen) 600mg  every 6 hours as needed for pain Take percocet every 4-6 hours as needed for pain. If you are not taking percocet you can take tylenol (acetaminophen) 650mg  every 6 hour as needed for pain Continue taking prenatal vitamin or iron pill Take colace or a stool softener daily to prevent constipation Take gas-x for gas pains   Diagnostic Laparoscopy, Care After The following information offers guidance on how to care for yourself after your procedure. Your health care provider may also give you more specific instructions. If you have problems or questions, contact your health care provider. What can I expect after the procedure? After the procedure, it is common to have:  Mild discomfort in the abdomen.  Sore throat. Women who have laparoscopy with a pelvic examination may have mild cramping and fluid coming from the vagina for a few days after the procedure. Follow these instructions at home: Medicines  Take over-the-counter and prescription medicines only as told by your health care provider.  If you were prescribed an antibiotic medicine, take it as told by your health care provider. Do not stop taking the antibiotic even if you start to feel better.  Ask your health care provider if the medicine prescribed to you: ? Requires you to avoid driving or using machinery. ? Can cause constipation. You may need to take these actions to prevent or treat constipation:  Drink enough fluid to keep your urine pale yellow.  Take over-the-counter or prescription medicines.  Eat foods that are high in fiber, such as beans, whole grains, and fresh fruits and vegetables.  Limit foods that are high in fat and processed sugars, such as fried or sweet foods. Incision care  Follow instructions from your health care provider about how to take care of your incisions. Make sure you: ? Wash your hands with soap and water for at least 20 seconds before and after  you change your bandage (dressing). If soap and water are not available, use hand sanitizer. ? Change your dressing as told by your health care provider. ? Leave stitches (sutures), skin glue, or surgical tape in place. These skin closures may need to stay in place for 2 weeks or longer. If surgical tape edges start to loosen and curl up, you may trim the loose edges. Do not remove the surgical tape completely unless your health care provider tells you to do that.  Check your incision areas every day for signs of infection. Check for: ? Redness, swelling, or pain. ? Fluid or blood. ? Warmth. ? Pus or a bad smell.   Activity  Return to your normal activities as told by your health care provider. Ask your health care provider what activities are safe for you.  Do not lift anything that is heavier than 10 lb (4.5 kg), or the limit that you are told, until your health care provider says that it is safe.  Avoid sitting for a long time without moving. Get up to take short walks every 1-2 hours. This is important to improve blood flow and breathing. Ask for help if you feel weak or unsteady. General instructions  Do not use any products that contain nicotine or tobacco. These products include cigarettes, chewing tobacco, and vaping devices, such as e-cigarettes. If you need help quitting, ask your health care provider.  If you were given a sedative during the procedure, it can affect you for several hours. Do not drive or operate machinery until your health care provider says  that it is safe.  Do not take baths, swim, or use a hot tub until your health care provider approves. Ask your health care provider if you may take showers. You may only be allowed to take sponge baths.  Keep all follow-up visits. This is important. Contact a health care provider if:  You develop shoulder pain.  You feel light-headed or faint.  You are unable to pass gas or have a bowel movement.  You feel nauseous or  you vomit.  You develop a rash.  You have any of these signs of infection: ? Redness, swelling, or pain around an incision. ? Fluid or blood coming from an incision. ? Warmth coming from an incision. ? Pus or a bad smell coming from an incision. ? A fever or chills. Get help right away if:  You have severe pain.  You have vomiting that does not go away.  You have heavy bleeding from the vagina.  Any incision opens up.  You have trouble breathing.  You have chest pain. These symptoms may represent a serious problem that is an emergency. Do not wait to see if the symptoms will go away. Get medical help right away. Call your local emergency services (911 in the [place] Do not drive yourself to the hospital. Summary  After the procedure, it is common to have mild discomfort in the abdomen and a sore throat.  Check your incision areas every day for signs of infection.  Return to your normal activities as told by your health care provider. Ask your health care provider what activities are safe for you. This information is not intended to replace advice given to you by your health care provider. Make sure you discuss any questions you have with your health care provider. Document Revised: 05/29/2020 Document Reviewed: 05/29/2020 Elsevier Patient Education  2021 [employer] Instructions  Activity: Get plenty of rest for the remainder of the day. A responsible individual must stay with you for 24 hours following the procedure.  For the next 24 hours, DO NOT: -Drive a car -Paediatric nurse -Drink alcoholic beverages -Take any medication unless instructed by your physician -Make any legal decisions or sign important papers.  Meals: Start with liquid foods such as gelatin or soup. Progress to regular foods as tolerated. Avoid greasy, spicy, heavy foods. If nausea and/or vomiting occur, drink only clear liquids until the nausea and/or vomiting  subsides. Call your physician if vomiting continues.  Special Instructions/Symptoms: Your throat may feel dry or sore from the anesthesia or the breathing tube placed in your throat during surgery. If this causes discomfort, gargle with warm salt water. The discomfort should disappear within 24 hours.  If you had a scopolamine patch placed behind your ear for the management of post- operative nausea and/or vomiting:  1. The medication in the patch is effective for 72 hours, after which it should be removed.  Wrap patch in a tissue and discard in the trash. Wash hands thoroughly with soap and water. 2. You may remove the patch earlier than 72 hours if you experience unpleasant side effects which may include dry mouth, dizziness or visual disturbances. 3. Avoid touching the patch. Wash your hands with soap and water after contact with the patch.  Do not take any Tylenol until after 3:30 pm today.

## 2021-02-10 NOTE — Anesthesia Preprocedure Evaluation (Signed)
Anesthesia Evaluation  Patient identified by MRN, date of birth, ID band Patient awake    Reviewed: Allergy & Precautions, NPO status , Patient's Chart, lab work & pertinent test results  Airway Mallampati: II  TM Distance: >3 FB Neck ROM: Full    Dental no notable dental hx.    Pulmonary neg pulmonary ROS,    Pulmonary exam normal breath sounds clear to auscultation       Cardiovascular negative cardio ROS Normal cardiovascular exam Rhythm:Regular Rate:Normal     Neuro/Psych negative neurological ROS  negative psych ROS   GI/Hepatic negative GI ROS, Neg liver ROS,   Endo/Other  negative endocrine ROS  Renal/GU negative Renal ROS  negative genitourinary   Musculoskeletal negative musculoskeletal ROS (+)   Abdominal   Peds negative pediatric ROS (+)  Hematology negative hematology ROS (+)   Anesthesia Other Findings   Reproductive/Obstetrics negative OB ROS                             Anesthesia Physical Anesthesia Plan  ASA: I  Anesthesia Plan: General   Post-op Pain Management:    Induction: Intravenous  PONV Risk Score and Plan: 3 and Ondansetron, Dexamethasone, Midazolam, Treatment may vary due to age or medical condition and Scopolamine patch - Pre-op  Airway Management Planned: Oral ETT  Additional Equipment:   Intra-op Plan:   Post-operative Plan: Extubation in OR  Informed Consent: I have reviewed the patients History and Physical, chart, labs and discussed the procedure including the risks, benefits and alternatives for the proposed anesthesia with the patient or authorized representative who has indicated his/her understanding and acceptance.   Dental advisory given  Plan Discussed with: CRNA and Surgeon  Anesthesia Plan Comments:         Anesthesia Quick Evaluation  

## 2021-02-10 NOTE — Interval H&P Note (Signed)
History and Physical Interval Note:  02/10/2021 9:41 AM  Bonnie Atkins  has presented today for surgery, with the diagnosis of pelvic pain.  The various methods of treatment have been discussed with the patient and family. After consideration of risks, benefits and other options for treatment, the patient has consented to   Procedure(s): LAPAROSCOPY DIAGNOSTIC w/ excision of endometriosis. as a surgical intervention.  The patient's history has been reviewed, patient examined, no change in status, stable for surgery.  I have reviewed the patient's chart and labs.  Questions were answered to the patient's satisfaction.     Tamaya Pun K Taam-Akelman

## 2021-02-12 ENCOUNTER — Encounter (HOSPITAL_BASED_OUTPATIENT_CLINIC_OR_DEPARTMENT_OTHER): Payer: Self-pay | Admitting: Obstetrics & Gynecology

## 2021-03-11 IMAGING — US ULTRASOUND LEFT BREAST LIMITED
1 series · 4 of 4 positions shown · non-contrast
Comparison: 07/09/2018 and 01/05/2018 ultrasounds

CLINICAL DATA: 38-year-old female for 1 year follow-up of LEFT
breast mass.

EXAM:
ULTRASOUND OF THE LEFT BREAST

[Series 1: ultrasound left breast limited · 0.06mm/px · 4 of 4 slices shown]
[im 1/4]
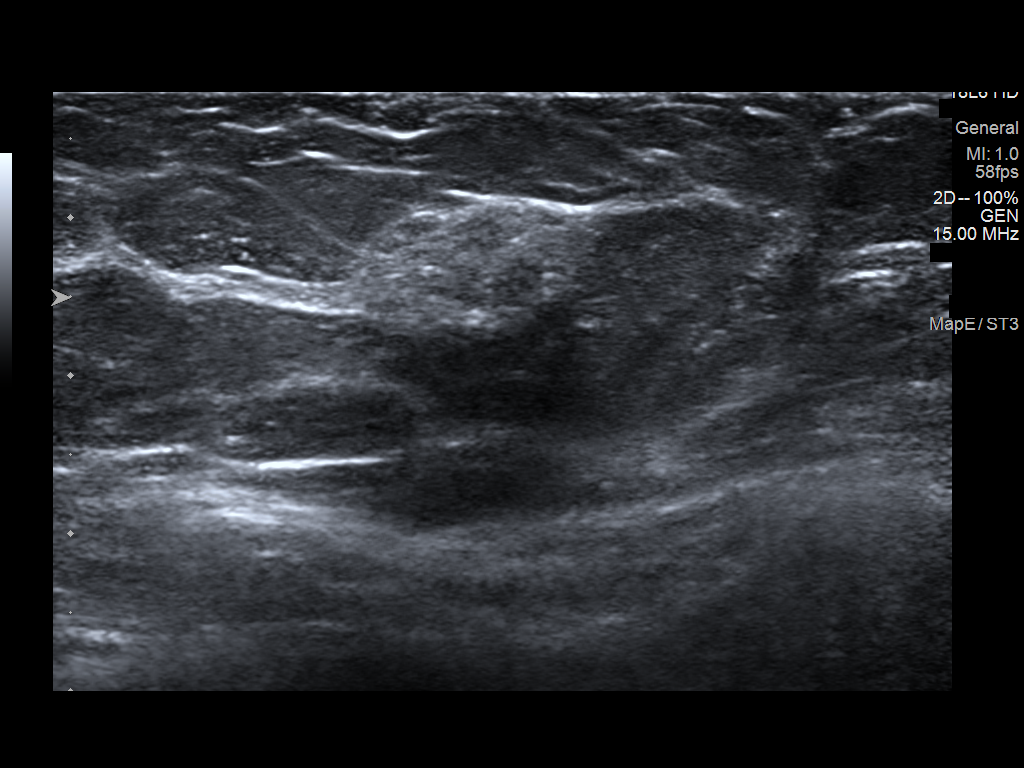
[im 2/4]
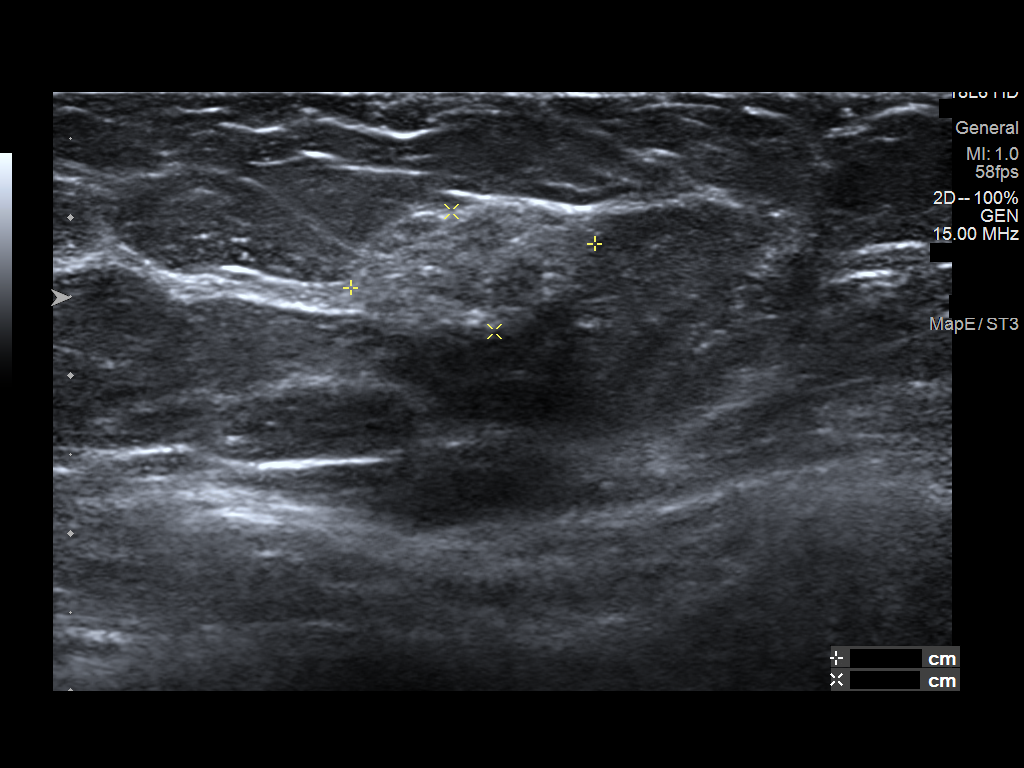
[im 3/4]
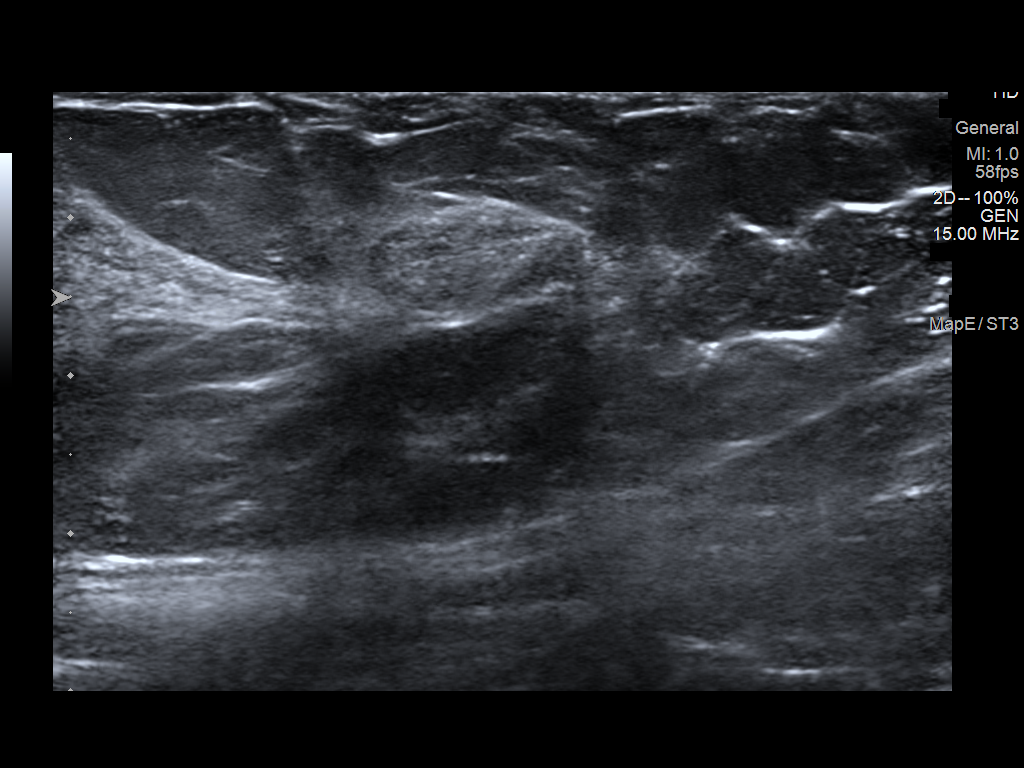
[im 4/4]
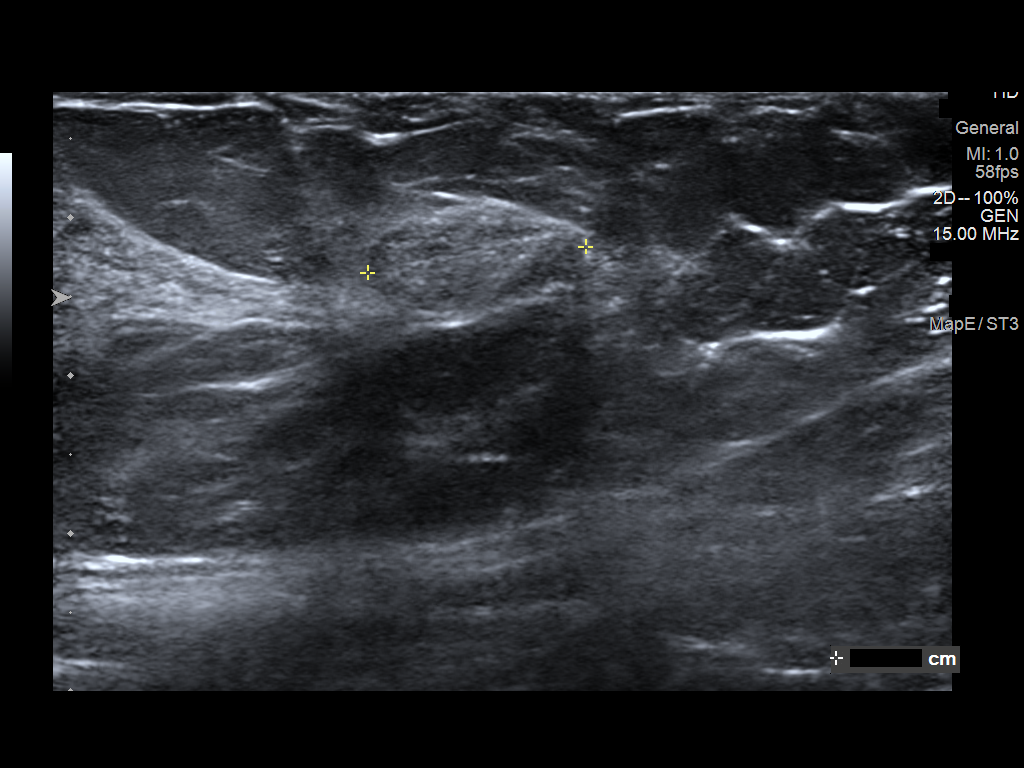

[4 of 4 positions shown; findings below may reference images not displayed]

FINDINGS: Targeted ultrasound is performed, showing a stable 1.6 x 0.8 x
cm circumscribed oval hyperechoic mass at the 7 o'clock position of
the LEFT breast 6 cm from the nipple.
IMPRESSION: Stable likely benign LEFT breast mass. One additional follow-up in 1
year recommended to ensure 2 year stability.

RECOMMENDATION:
LEFT breast ultrasound in 1 year.

I have discussed the findings and recommendations with the patient.
If applicable, a reminder letter will be sent to the patient
regarding the next appointment.

BI-RADS CATEGORY  3: Probably benign.

## 2021-09-01 ENCOUNTER — Other Ambulatory Visit: Payer: Self-pay

## 2021-09-01 ENCOUNTER — Encounter: Payer: Self-pay | Admitting: Emergency Medicine

## 2021-09-01 ENCOUNTER — Emergency Department: Admit: 2021-09-01 | Payer: Self-pay

## 2021-09-01 ENCOUNTER — Emergency Department (INDEPENDENT_AMBULATORY_CARE_PROVIDER_SITE_OTHER)
Admission: EM | Admit: 2021-09-01 | Discharge: 2021-09-01 | Disposition: A | Discharge status: Home / Self Care | Payer: 59 | Source: Home / Self Care | Attending: Family Medicine | Admitting: Family Medicine

## 2021-09-01 DIAGNOSIS — J209 Acute bronchitis, unspecified: Secondary | ICD-10-CM | POA: Diagnosis not present

## 2021-09-01 MED ORDER — AMOXICILLIN 875 MG PO TABS: 875.0000 mg | ORAL_TABLET | Freq: Two times a day (BID) | ORAL | 0 refills | Status: DC

## 2021-09-01 MED ORDER — PREDNISONE 20 MG PO TABS: 20.0000 mg | ORAL_TABLET | Freq: Two times a day (BID) | ORAL | 0 refills | Status: DC

## 2021-09-01 MED ORDER — BENZONATATE 200 MG PO CAPS: 200.0000 mg | ORAL_CAPSULE | Freq: Three times a day (TID) | ORAL | 0 refills | Status: DC | PRN

## 2021-09-01 NOTE — ED Triage Notes (Signed)
Productive cough x 14 days, green mucus, pain and pressure between eyes, ears are full Unvaccinated for Covid or flu

## 2021-09-01 NOTE — ED Provider Notes (Signed)
[name]    CSN: [id number] Arrival date & time: 09/01/21  1654      History   Chief Complaint Chief Complaint  Patient presents with   Cough    HPI Bonnie Atkins is a 40 y.o. female.   HPI  Cough cold runny nose at first, is left with a productive cough for 14 days.  Green mucus.  Very tired.  Pain and pressure between the eyes.  Ears are full.  Postnasal drip.  Unvaccinated for covid and flu  Past Medical History:  Diagnosis Date   Asthma    exercise induced asthma   Bronchitis 01/22/2021   resolved with predisone    Constipation    COVID 09/2020   headache, fever fatige x 10 days all symptoms resolved   Pelvic pain    Wears glasses     Patient Active Problem List   Diagnosis Date Noted   Calcific tendinitis of right shoulder region 05/15/2020   Right shoulder pain 03/30/2020   History of partial thyroidectomy 02/08/2018   Seasonal allergies 10/31/2017   Allergic reaction to contrast dye 07/07/2017   Disease of thyroid gland 07/07/2017   Dysphagia 07/07/2017   Fish allergy 07/07/2017   Left thyroid nodule 02/03/2017   Active labor at term 04/13/2014   Supervision of other normal pregnancy 04/13/2014   Asthma 03/23/2013    Past Surgical History:  Procedure Laterality Date   BREAST EXCISIONAL BIOPSY Right    benign 20 years ago   BREAST MASS EXCISION Right yrs ago   Benign   LAPAROSCOPY N/A 02/10/2021   Procedure: LAPAROSCOPY DIAGNOSTIC;  Surgeon: Jonelle Sidle, MD;  Location: [facility];  Service: Gynecology;  Laterality: N/A;   partial thryoidectomy Left 2019   cyst removed   right knee arthroscopic tendon replaced  2019   right rotator cuff repair  08/2020   [facility]   WISDOM TOOTH EXTRACTION  age 2    OB History     Gravida  1   Para  1   Term  1   Preterm      AB      Living  1      SAB      IAB      Ectopic      Multiple      Live Births  1             Home Medications    Prior to Admission medications   Medication Sig Start Date End Date Taking? Authorizing Provider  amoxicillin (AMOXIL) 875 MG tablet Take 1 tablet (875 mg total) by mouth 2 (two) times daily. 09/01/21  Yes Raylene Everts, MD  benzonatate (TESSALON) 200 MG capsule Take 1 capsule (200 mg total) by mouth 3 (three) times daily as needed for cough. 09/01/21  Yes Raylene Everts, MD  norethindrone (MICRONOR) 0.35 MG tablet Take 1 tablet by mouth daily.   Yes [provider]  predniSONE (DELTASONE) 20 MG tablet Take 1 tablet (20 mg total) by mouth 2 (two) times daily with a meal. 09/01/21  Yes Raylene Everts, MD  Ascorbic Acid (VITAMIN C PO) Take by mouth daily.    [provider]  ibuprofen (ADVIL) 600 MG tablet Take 1 tablet (600 mg total) by mouth every 6 (six) hours as needed. 02/10/21   Jonelle Sidle, MD  Multiple Vitamins-Minerals (ZINC PO) Take by mouth daily.    [provider]  VITAMIN  D PO Take by mouth daily.    [provider]    Family History Family History  Problem Relation Age of Onset   Breast cancer Maternal Grandmother        unsure of age   Healthy Mother    Diabetes Father    Cancer Father     Social History Social History   Tobacco Use   Smoking status: Never   Smokeless tobacco: Never  Vaping Use   Vaping Use: Never used  Substance Use Topics   Alcohol use: No   Drug use: No     Allergies   Erythromycin, Other, Sulfa antibiotics, Codeine, and Fish-derived products   Review of Systems Review of Systems See HPI  Physical Exam Triage Vital Signs ED Triage Vitals  Enc Vitals Group     BP 09/01/21 1828 (!) 142/76     Pulse Rate 09/01/21 1828 79     Resp 09/01/21 1828 18     Temp 09/01/21 1828 98.7 F (37.1 C)     Temp Source 09/01/21 1828 Oral     SpO2 09/01/21 1828 95 %     Weight 09/01/21 1829 125 lb (56.7 kg)     Height 09/01/21 1829 5\' 4"  (1.626 m)     Head  Circumference --      Peak Flow --      Pain Score 09/01/21 1829 5     Pain Loc --      Pain Edu? --      Excl. in [place]? --    No data found.  Updated Vital Signs BP (!) 142/76 (BP Location: Left Arm)   Pulse 79   Temp 98.7 F (37.1 C) (Oral)   Resp 18   Ht 5\' 4"  (1.626 m)   Wt 56.7 kg   SpO2 95%   BMI 21.46 kg/m     Physical Exam Constitutional:      General: She is not in acute distress.    Appearance: She is well-developed.  HENT:     Head: Normocephalic and atraumatic.     Right Ear: Tympanic membrane, ear canal and external ear normal.     Left Ear: Tympanic membrane, ear canal and external ear normal.     Mouth/Throat:     Comments: Lymphoid hyperplasia posterior pharynx.  Nasal congestion Eyes:     Conjunctiva/sclera: Conjunctivae normal.     Pupils: Pupils are equal, round, and reactive to light.  Cardiovascular:     Rate and Rhythm: Normal rate.  Pulmonary:     Effort: Pulmonary effort is normal. No respiratory distress.     Breath sounds: Rhonchi present.  Abdominal:     General: There is no distension.     Palpations: Abdomen is soft.  Musculoskeletal:        General: Normal range of motion.     Cervical back: Normal range of motion.  Lymphadenopathy:     Cervical: Cervical adenopathy present.  Skin:    General: Skin is warm and dry.  Neurological:     Mental Status: She is alert.     UC Treatments / Results  Labs (all labs ordered are listed, but only abnormal results are displayed) Labs Reviewed - No data to display  EKG   Radiology No results found.  Procedures Procedures (including critical care time)  Medications Ordered in UC Medications - No data to display  Initial Impression / Assessment and Plan / UC Course  I have reviewed the triage vital  signs and the nursing notes.  Pertinent labs & imaging results that were available during my care of the patient were reviewed by me and considered in my medical decision making (see  chart for details).     Final Clinical Impressions(s) / UC Diagnoses   Final diagnoses:  Acute bronchitis, unspecified organism     Discharge Instructions      Take both the prednisone and the amoxicillin 2 times a day Continue to drink lots of fluids Consider Mucinex for the thick mucus Take Tessalon 2-3 times a day for cough   ED Prescriptions     Medication Sig Dispense Auth. Provider   benzonatate (TESSALON) 200 MG capsule Take 1 capsule (200 mg total) by mouth 3 (three) times daily as needed for cough. 21 capsule Raylene Everts, MD   amoxicillin (AMOXIL) 875 MG tablet Take 1 tablet (875 mg total) by mouth 2 (two) times daily. 14 tablet Raylene Everts, MD   predniSONE (DELTASONE) 20 MG tablet Take 1 tablet (20 mg total) by mouth 2 (two) times daily with a meal. 10 tablet Raylene Everts, MD      PDMP not reviewed this encounter.   Raylene Everts, MD 09/01/21 Joen Laura

## 2021-09-01 NOTE — Discharge Instructions (Signed)
Take both the prednisone and the amoxicillin 2 times a day Continue to drink lots of fluids Consider Mucinex for the thick mucus Take Tessalon 2-3 times a day for cough

## 2021-09-13 ENCOUNTER — Emergency Department (INDEPENDENT_AMBULATORY_CARE_PROVIDER_SITE_OTHER)
Admission: EM | Admit: 2021-09-13 | Discharge: 2021-09-13 | Disposition: A | Discharge status: Home / Self Care | Payer: 59 | Source: Home / Self Care

## 2021-09-13 ENCOUNTER — Other Ambulatory Visit: Payer: Self-pay

## 2021-09-13 DIAGNOSIS — R059 Cough, unspecified: Secondary | ICD-10-CM | POA: Diagnosis not present

## 2021-09-13 DIAGNOSIS — J309 Allergic rhinitis, unspecified: Secondary | ICD-10-CM

## 2021-09-13 MED ORDER — FEXOFENADINE HCL 180 MG PO TABS: 180.0000 mg | ORAL_TABLET | Freq: Every day | ORAL | 0 refills | Status: DC

## 2021-09-13 NOTE — ED Provider Notes (Signed)
[facility]    CSN: [id number] Arrival date & time: 09/13/21  1025      History   Chief Complaint Chief Complaint  Patient presents with   Cough   Ear Fullness    HPI Bonnie Atkins is a 40 y.o. female.   HPI 40 year old female presents with cough and ear fullness for 1 month.  Patient was evaluated here on 09/01/2021 for same thing prescribed amoxicillin, prednisone, and Tessalon Perles.  Past Medical History:  Diagnosis Date   Asthma    exercise induced asthma   Bronchitis 01/22/2021   resolved with predisone    Constipation    COVID 09/2020   headache, fever fatige x 10 days all symptoms resolved   Pelvic pain    Wears glasses     Patient Active Problem List   Diagnosis Date Noted   Calcific tendinitis of right shoulder region 05/15/2020   Right shoulder pain 03/30/2020   History of partial thyroidectomy 02/08/2018   Seasonal allergies 10/31/2017   Allergic reaction to contrast dye 07/07/2017   Disease of thyroid gland 07/07/2017   Dysphagia 07/07/2017   Fish allergy 07/07/2017   Left thyroid nodule 02/03/2017   Active labor at term 04/13/2014   Supervision of other normal pregnancy 04/13/2014   Asthma 03/23/2013    Past Surgical History:  Procedure Laterality Date   BREAST EXCISIONAL BIOPSY Right    benign 20 years ago   BREAST MASS EXCISION Right yrs ago   Benign   LAPAROSCOPY N/A 02/10/2021   Procedure: LAPAROSCOPY DIAGNOSTIC;  Surgeon: Jonelle Sidle, MD;  Location: [facility];  Service: Gynecology;  Laterality: N/A;   partial thryoidectomy Left 2019   cyst removed   right knee arthroscopic tendon replaced  2019   right rotator cuff repair  08/2020   [facility]   WISDOM TOOTH EXTRACTION  age 16    OB History     Gravida  1   Para  1   Term  1   Preterm      AB      Living  1      SAB      IAB      Ectopic      Multiple      Live Births  1            Home  Medications    Prior to Admission medications   Medication Sig Start Date End Date Taking? Authorizing Provider  fexofenadine (ALLEGRA ALLERGY) 180 MG tablet Take 1 tablet (180 mg total) by mouth daily for 15 days. 09/13/21 09/28/21 Yes Eliezer Lofts, FNP  amoxicillin (AMOXIL) 875 MG tablet Take 1 tablet (875 mg total) by mouth 2 (two) times daily. 09/01/21   Raylene Everts, MD  Ascorbic Acid (VITAMIN C PO) Take by mouth daily.    [provider]  benzonatate (TESSALON) 200 MG capsule Take 1 capsule (200 mg total) by mouth 3 (three) times daily as needed for cough. 09/01/21   Raylene Everts, MD  ibuprofen (ADVIL) 600 MG tablet Take 1 tablet (600 mg total) by mouth every 6 (six) hours as needed. 02/10/21   Jonelle Sidle, MD  Multiple Vitamins-Minerals (ZINC PO) Take by mouth daily.    [provider]  norethindrone (MICRONOR) 0.35 MG tablet Take 1 tablet by mouth daily.    [provider]  predniSONE (DELTASONE) 20 MG tablet Take 1 tablet (20 mg total) by mouth 2 (two)  times daily with a meal. 09/01/21   Raylene Everts, MD  VITAMIN D PO Take by mouth daily.    [provider]    Family History Family History  Problem Relation Age of Onset   Breast cancer Maternal Grandmother        unsure of age   Healthy Mother    Diabetes Father    Cancer Father     Social History Social History   Tobacco Use   Smoking status: Never   Smokeless tobacco: Never  Vaping Use   Vaping Use: Never used  Substance Use Topics   Alcohol use: No   Drug use: No     Allergies   Erythromycin, Other, Sulfa antibiotics, Codeine, and Fish-derived products   Review of Systems Review of Systems  HENT:  Positive for congestion.   Respiratory:  Positive for cough.   All other systems reviewed and are negative.   Physical Exam Triage Vital Signs ED Triage Vitals  Enc Vitals Group     BP 09/13/21 1137 113/77     Pulse Rate 09/13/21 1137 94      Resp 09/13/21 1137 18     Temp 09/13/21 1137 98.3 F (36.8 C)     Temp Source 09/13/21 1137 Oral     SpO2 09/13/21 1137 98 %     Weight --      Height --      Head Circumference --      Peak Flow --      Pain Score 09/13/21 1050 0     Pain Loc --      Pain Edu? --      Excl. in [place]? --    No data found.  Updated Vital Signs BP 113/77 (BP Location: Right Arm)   Pulse 94   Temp 98.3 F (36.8 C) (Oral)   Resp 18   SpO2 98%    Physical Exam Vitals and nursing note reviewed.  Constitutional:      General: She is not in acute distress.    Appearance: Normal appearance. She is normal weight. She is not ill-appearing.  HENT:     Head: Normocephalic and atraumatic.     Right Ear: Tympanic membrane, ear canal and external ear normal.     Left Ear: Tympanic membrane, ear canal and external ear normal.     Nose: Nose normal.     Mouth/Throat:     Mouth: Mucous membranes are moist.     Pharynx: Oropharynx is clear.     Comments: Moderate amount of clear drainage of posterior oropharynx noted Eyes:     Extraocular Movements: Extraocular movements intact.     Conjunctiva/sclera: Conjunctivae normal.     Pupils: Pupils are equal, round, and reactive to light.  Cardiovascular:     Rate and Rhythm: Normal rate and regular rhythm.     Pulses: Normal pulses.     Heart sounds: Normal heart sounds.  Pulmonary:     Effort: Pulmonary effort is normal.     Breath sounds: Normal breath sounds. No wheezing, rhonchi or rales.  Musculoskeletal:        General: Normal range of motion.     Cervical back: Normal range of motion and neck supple.  Skin:    General: Skin is warm and dry.  Neurological:     General: No focal deficit present.     Mental Status: She is alert and oriented to person, place, and time.  UC Treatments / Results  Labs (all labs ordered are listed, but only abnormal results are displayed) Labs Reviewed - No data to display  EKG   Radiology No results  found.  Procedures Procedures (including critical care time)  Medications Ordered in UC Medications - No data to display  Initial Impression / Assessment and Plan / UC Course  I have reviewed the triage vital signs and the nursing notes.  Pertinent labs & imaging results that were available during my care of the patient were reviewed by me and considered in my medical decision making (see chart for details).     MDM: 1.  Cough-continue Tessalon Perles as needed; 2..  Allergic rhinitis-Rx'd Allegra. Advised patient to take Allegra daily for the next 5 to 7 days for concurrent postnasal drip/drainage. Advised may use Allegra afterwards as needed.  Encouraged patient  to increase daily water intake while taking this medication.  Patient discharged home, hemodynamically stable. Final Clinical Impressions(s) / UC Diagnoses   Final diagnoses:  Cough, unspecified type  Allergic rhinitis, unspecified seasonality, unspecified trigger     Discharge Instructions      Advised patient to take Allegra daily for the next 5 to 7 days for concurrent postnasal drip/drainage. Advised may use Allegra afterwards as needed.  Courage patient increase daily water intake while taking this medication.     ED Prescriptions     Medication Sig Dispense Auth. Provider   fexofenadine (ALLEGRA ALLERGY) 180 MG tablet Take 1 tablet (180 mg total) by mouth daily for 15 days. 15 tablet Eliezer Lofts, FNP      PDMP not reviewed this encounter.   Eliezer Lofts, [place] 09/13/21 1304

## 2021-09-13 NOTE — ED Triage Notes (Signed)
Pt c/o cough and ear fullness x 1 month. Was seen here 11/16 for same thing. Rx'd Amoxicillin and prednisone. (Finished course) Tessalon as well with no relief.

## 2021-09-13 NOTE — Discharge Instructions (Addendum)
Advised patient to take Allegra daily for the next 5 to 7 days for concurrent postnasal drip/drainage. Advised may use Allegra afterwards as needed.  Encourage patient increase daily water intake while taking this medication.

## 2021-11-18 ENCOUNTER — Other Ambulatory Visit: Payer: Self-pay

## 2021-11-18 ENCOUNTER — Ambulatory Visit (INDEPENDENT_AMBULATORY_CARE_PROVIDER_SITE_OTHER): Payer: BC Managed Care – PPO | Admitting: Physician Assistant

## 2021-11-18 ENCOUNTER — Encounter: Payer: Self-pay | Admitting: Physician Assistant

## 2021-11-18 DIAGNOSIS — L570 Actinic keratosis: Secondary | ICD-10-CM | POA: Diagnosis not present

## 2021-11-18 DIAGNOSIS — D485 Neoplasm of uncertain behavior of skin: Secondary | ICD-10-CM

## 2021-11-18 NOTE — Patient Instructions (Signed)

## 2021-11-24 ENCOUNTER — Telehealth: Payer: Self-pay | Admitting: *Deleted

## 2021-11-24 NOTE — Telephone Encounter (Signed)
-----   Message from Warren Danes, [place] sent at 11/23/2021  4:04 PM EST ----- AK rtc if recurs

## 2021-11-24 NOTE — Telephone Encounter (Signed)
Path to patient  

## 2021-12-05 IMAGING — DX DG CHEST 2V
2 series · 2 of 2 positions shown · non-contrast
Comparison: None.

CLINICAL DATA: Cough.  Exercise-induced asthma.

EXAM:
CHEST - 2 VIEW

[chest pa]
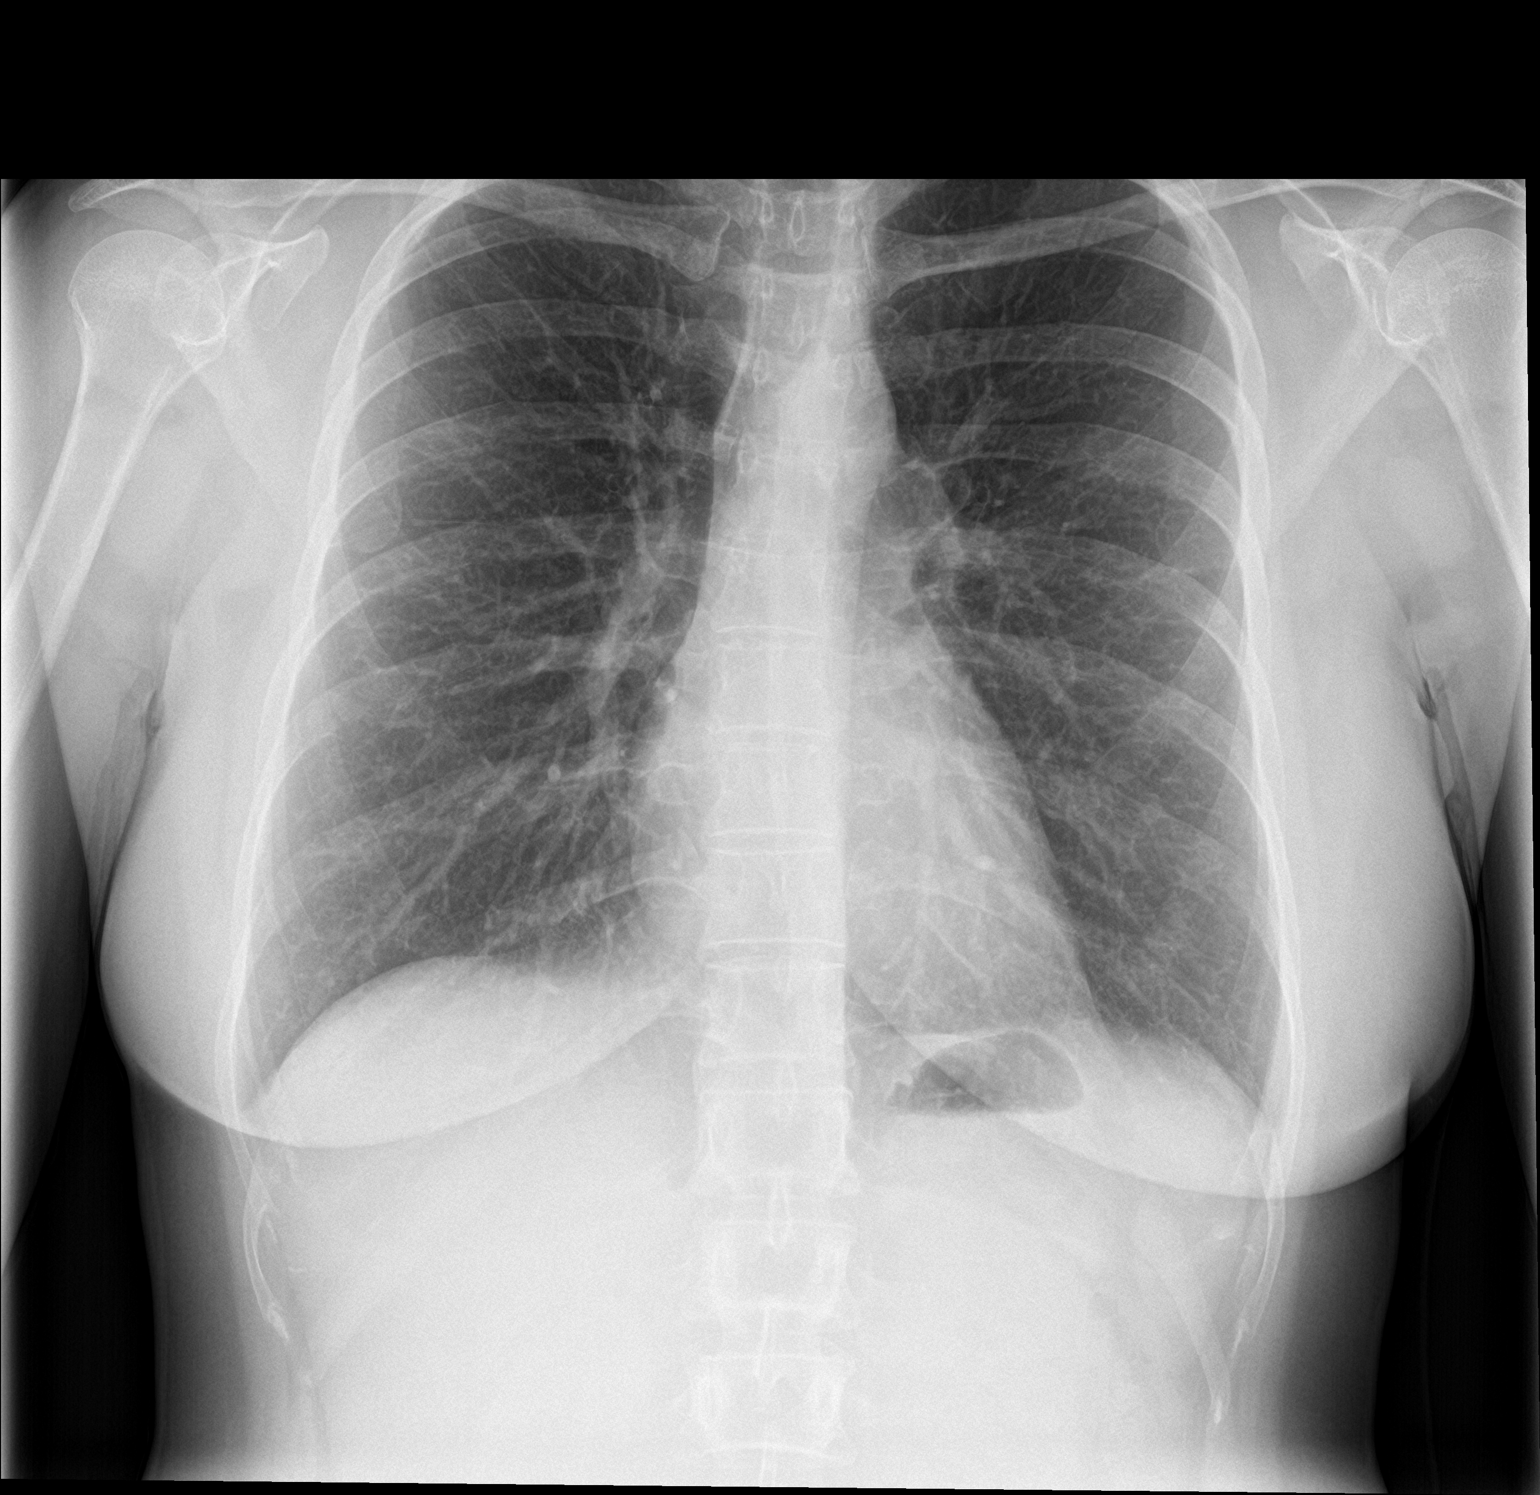

[chest lat]
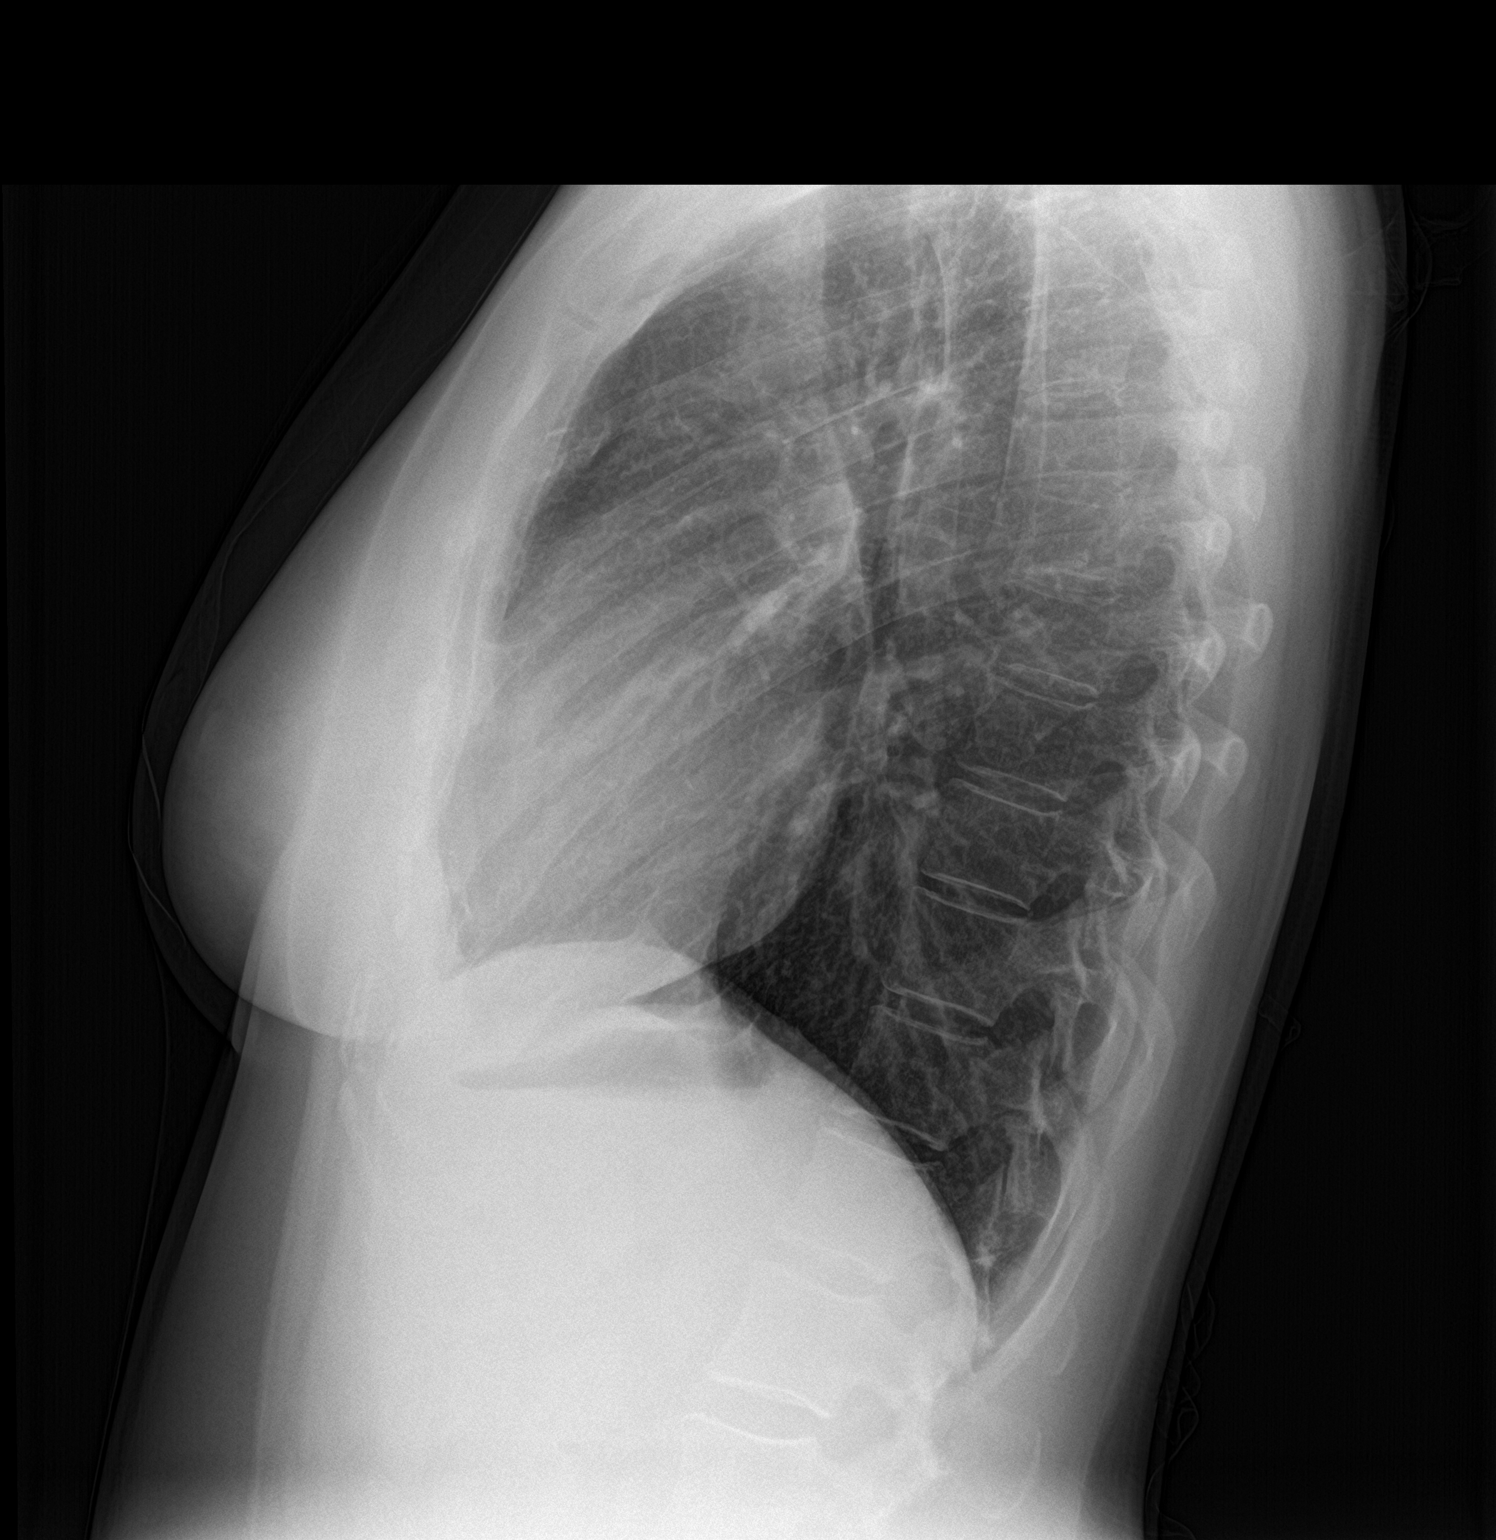

[2 of 2 positions shown; findings below may reference images not displayed]

FINDINGS: The heart size and mediastinal contours are within normal limits.
Both lungs are clear. The visualized skeletal structures are
unremarkable.
IMPRESSION: No active cardiopulmonary disease.

## 2021-12-12 ENCOUNTER — Encounter: Payer: Self-pay | Admitting: Physician Assistant

## 2021-12-12 NOTE — Progress Notes (Signed)
° °  New Patient   Subjective  Bonnie Atkins is a 41 y.o. female who presents for the following: Skin Tag (Possible tag under the eye x weeks raised really fast painful ).   The following portions of the chart were reviewed this encounter and updated as appropriate:  Tobacco   Allergies   Meds   Problems   Med Hx   Surg Hx   Fam Hx       Objective  Well appearing patient in no apparent distress; mood and affect are within normal limits.  A full examination was performed including scalp, head, eyes, ears, nose, lips, neck, chest, axillae, abdomen, back, buttocks, bilateral upper extremities, bilateral lower extremities, hands, feet, fingers, toes, fingernails, and toenails. All findings within normal limits unless otherwise noted below.  Right Lower Eyelid [place]cano growth on pink base         Assessment & Plan  Neoplasm of uncertain behavior of skin Right Lower Eyelid  Skin / nail biopsy Type of biopsy: tangential   Informed consent: discussed and consent obtained   Timeout: patient name, date of birth, surgical site, and procedure verified   Anesthesia: the lesion was anesthetized in a standard fashion   Anesthetic:  1% lidocaine w/ epinephrine 1-100,000 local infiltration Instrument used: flexible razor blade   Hemostasis achieved with: aluminum chloride and electrodesiccation   Outcome: patient tolerated procedure well   Post-procedure details: wound care instructions given    Destruction of lesion Complexity: simple   Destruction method: electrodesiccation and curettage   Informed consent: discussed and consent obtained   Timeout:  patient name, date of birth, surgical site, and procedure verified Anesthesia: the lesion was anesthetized in a standard fashion   Anesthetic:  1% lidocaine w/ epinephrine 1-100,000 local infiltration Curettage performed in three different directions: Yes   Electrodesiccation performed over the curetted area: Yes   Curettage cycles:   3 Margin per side (cm):  0.1 Final wound size (cm):  0.6 Hemostasis achieved with:  aluminum chloride Outcome: patient tolerated procedure well with no complications   Post-procedure details: wound care instructions given   Additional details:  Wound inoculated with 5% fluorouracil solution  Specimen 1 - Surgical pathology Differential Diagnosis: bcc vs scc -[id number]  Check Margins: No     I, Alissa Pharr, PA-C, have reviewed all documentation's for this visit.  The documentation on 12/12/21 for the exam, diagnosis, procedures and orders are all accurate and complete.

## 2022-05-18 ENCOUNTER — Emergency Department
Admission: EM | Admit: 2022-05-18 | Discharge: 2022-05-18 | Disposition: A | Discharge status: Home / Self Care | Payer: BC Managed Care – PPO | Attending: Family Medicine | Admitting: Family Medicine

## 2022-05-18 ENCOUNTER — Encounter: Payer: Self-pay | Admitting: Family Medicine

## 2022-05-18 DIAGNOSIS — J0100 Acute maxillary sinusitis, unspecified: Secondary | ICD-10-CM | POA: Diagnosis not present

## 2022-05-18 DIAGNOSIS — R059 Cough, unspecified: Secondary | ICD-10-CM

## 2022-05-18 DIAGNOSIS — J3489 Other specified disorders of nose and nasal sinuses: Secondary | ICD-10-CM

## 2022-05-18 DIAGNOSIS — J309 Allergic rhinitis, unspecified: Secondary | ICD-10-CM | POA: Diagnosis not present

## 2022-05-18 MED ORDER — PREDNISONE 20 MG PO TABS: ORAL_TABLET | ORAL | 0 refills | Status: DC

## 2022-05-18 MED ORDER — ALBUTEROL SULFATE HFA 108 (90 BASE) MCG/ACT IN AERS: 1.0000 | INHALATION_SPRAY | Freq: Four times a day (QID) | RESPIRATORY_TRACT | 0 refills | Status: DC | PRN

## 2022-05-18 MED ORDER — BENZONATATE 200 MG PO CAPS: 200.0000 mg | ORAL_CAPSULE | Freq: Three times a day (TID) | ORAL | 0 refills | Status: AC | PRN

## 2022-05-18 MED ORDER — FEXOFENADINE HCL 180 MG PO TABS: 180.0000 mg | ORAL_TABLET | Freq: Every day | ORAL | 0 refills | Status: DC

## 2022-05-18 MED ORDER — AMOXICILLIN-POT CLAVULANATE 875-125 MG PO TABS: 1.0000 | ORAL_TABLET | Freq: Two times a day (BID) | ORAL | 0 refills | Status: AC

## 2022-05-18 NOTE — ED Triage Notes (Signed)
Pt comes to urgent care with complaints of sinus pain for the last 3 weeks. Cough, sore, throat, pain in left ear that developed within the last week and a half.

## 2022-05-18 NOTE — ED Provider Notes (Signed)
[name]    CSN: [id number] Arrival date & time: 05/18/22  1401      History   Chief Complaint No chief complaint on file.   HPI Bonnie Atkins is a 41 y.o. female.   HPI 41 year old female presents with sinus pain for the past 3 weeks.  Additionally, patient reports cough, sore throat, and pain of the left ear that developed within the last 1.5 weeks. PMH significant for asthma, disease of thyroid gland, and calcific tendinitis of right shoulder region.  Patient request refill of albuterol inhaler.  Past Medical History:  Diagnosis Date   Asthma    exercise induced asthma   Bronchitis 01/22/2021   resolved with predisone    Constipation    COVID 09/2020   headache, fever fatige x 10 days all symptoms resolved   Pelvic pain    Wears glasses     Patient Active Problem List   Diagnosis Date Noted   Calcific tendinitis of right shoulder region 05/15/2020   Right shoulder pain 03/30/2020   History of partial thyroidectomy 02/08/2018   Seasonal allergies 10/31/2017   Allergic reaction to contrast dye 07/07/2017   Disease of thyroid gland 07/07/2017   Dysphagia 07/07/2017   Fish allergy 07/07/2017   Left thyroid nodule 02/03/2017   Active labor at term 04/13/2014   Supervision of other normal pregnancy 04/13/2014   Asthma 03/23/2013    Past Surgical History:  Procedure Laterality Date   BREAST EXCISIONAL BIOPSY Right    benign 20 years ago   BREAST MASS EXCISION Right yrs ago   Benign   LAPAROSCOPY N/A 02/10/2021   Procedure: LAPAROSCOPY DIAGNOSTIC;  Surgeon: Jonelle Sidle, MD;  Location: [place];  Service: Gynecology;  Laterality: N/A;   partial thryoidectomy Left 2019   cyst removed   right knee arthroscopic tendon replaced  2019   right rotator cuff repair  08/2020   [facility]   WISDOM TOOTH EXTRACTION  age 30    OB History     Gravida  1   Para  1   Term  1   Preterm      AB       Living  1      SAB      IAB      Ectopic      Multiple      Live Births  1            Home Medications    Prior to Admission medications   Medication Sig Start Date End Date Taking? Authorizing Provider  albuterol (VENTOLIN HFA) 108 (90 Base) MCG/ACT inhaler Inhale 1-2 puffs into the lungs every 6 (six) hours as needed for wheezing or shortness of breath. 05/18/22  Yes Eliezer Lofts, FNP  amoxicillin-clavulanate (AUGMENTIN) 875-125 MG tablet Take 1 tablet by mouth 2 (two) times daily for 7 days. 05/18/22 05/25/22 Yes Eliezer Lofts, FNP  benzonatate (TESSALON) 200 MG capsule Take 1 capsule (200 mg total) by mouth 3 (three) times daily as needed for up to 7 days. 05/18/22 05/25/22 Yes Eliezer Lofts, FNP  fexofenadine [facility] ALLERGY) 180 MG tablet Take 1 tablet (180 mg total) by mouth daily for 15 days. 05/18/22 06/02/22 Yes Eliezer Lofts, FNP  norethindrone (AYGESTIN) 5 MG tablet Take 5 mg by mouth daily. 04/25/22   [provider]  norethindrone (MICRONOR) 0.35 MG tablet Take 1 tablet by mouth daily.    [provider]  predniSONE (DELTASONE) 20 MG  tablet Take 3 tabs PO daily x 5 days. 05/18/22  Yes Eliezer Lofts, FNP    Family History Family History  Problem Relation Age of Onset   Breast cancer Maternal Grandmother        unsure of age   Healthy Mother    Diabetes Father    Cancer Father     Social History Social History   Tobacco Use   Smoking status: Never   Smokeless tobacco: Never  Vaping Use   Vaping Use: Never used  Substance Use Topics   Alcohol use: No   Drug use: No     Allergies   Erythromycin, Other, Sulfa antibiotics, Codeine, and Fish-derived products   Review of Systems Review of Systems  HENT:  Positive for congestion, sinus pressure, sinus pain and sore throat.   Respiratory:  Positive for cough.   All other systems reviewed and are negative.    Physical Exam Triage Vital Signs ED Triage Vitals  Enc Vitals Group     BP       Pulse      Resp      Temp      Temp src      SpO2      Weight      Height      Head Circumference      Peak Flow      Pain Score      Pain Loc      Pain Edu?      Excl. in [place]?    No data found.  Updated Vital Signs BP 129/79 (BP Location: Right Arm)   Pulse 68   Temp 99.5 F (37.5 C) (Oral)   Resp 17   SpO2 98%    Physical Exam Vitals and nursing note reviewed.  Constitutional:      Appearance: Normal appearance. She is normal weight.  HENT:     Head: Normocephalic.     Right Ear: Tympanic membrane and external ear normal.     Left Ear: Tympanic membrane and external ear normal.     Ears:     Comments: Moderate eustachian tube dysfunction noted bilaterally    Nose:     Right Sinus: Maxillary sinus tenderness and frontal sinus tenderness present.     Left Sinus: Maxillary sinus tenderness and frontal sinus tenderness present.     Comments: Turbinates are erythematous/edematous    Mouth/Throat:     Mouth: Mucous membranes are moist.     Pharynx: Oropharynx is clear.     Comments: Moderate to significant amount of clear drainage of posterior oropharynx noted Eyes:     Extraocular Movements: Extraocular movements intact.     Conjunctiva/sclera: Conjunctivae normal.     Pupils: Pupils are equal, round, and reactive to light.  Cardiovascular:     Rate and Rhythm: Normal rate and regular rhythm.     Pulses: Normal pulses.     Heart sounds: Normal heart sounds.  Pulmonary:     Effort: Pulmonary effort is normal.     Breath sounds: Normal breath sounds. No wheezing, rhonchi or rales.     Comments: Infrequent nonproductive cough noted on exam Musculoskeletal:     Cervical back: Normal range of motion and neck supple. No tenderness.  Lymphadenopathy:     Cervical: No cervical adenopathy.  Skin:    General: Skin is warm and dry.  Neurological:     General: No focal deficit present.     Mental Status: She is alert  and oriented to person, place, and time.       UC Treatments / Results  Labs (all labs ordered are listed, but only abnormal results are displayed) Labs Reviewed - No data to display  EKG   Radiology No results found.  Procedures Procedures (including critical care time)  Medications Ordered in UC Medications - No data to display  Initial Impression / Assessment and Plan / UC Course  I have reviewed the triage vital signs and the nursing notes.  Pertinent labs & imaging results that were available during my care of the patient were reviewed by me and considered in my medical decision making (see chart for details).     MDM: 1.  Subacute maxillary sinusitis-Rx'd Augmentin; 2.  Sinus pressure-Rx'd prednisone; 3.  Allergic rhinitis-Rx'd Allegra; 4.  Cough-Rx'd Tessalon Perles. Advised patient to take medication as directed with food to completion.  Advised patient to take Allegra and prednisone with first dose of Augmentin for the next 5 of 7 days.  Advised may use Allegra as needed afterwards for concurrent postnasal drainage/drip.  Advised may use Tessalon Perles daily or as needed for cough.  Encouraged patient to increase daily water intake while taking these medications.  Advised patient if symptoms worsen and/or unresolved please follow-up with PCP or here for further evaluation.  Patient discharged home, hemodynamically stable. Final Clinical Impressions(s) / UC Diagnoses   Final diagnoses:  Subacute maxillary sinusitis  Sinus pressure  Allergic rhinitis, unspecified seasonality, unspecified trigger  Cough, unspecified type     Discharge Instructions      Advised patient to take medication as directed with food to completion.  Advised patient to take Allegra and prednisone with first dose of Augmentin for the next 5 of 7 days.  Advised may use Allegra as needed afterwards for concurrent postnasal drainage/drip.  Advised may use Tessalon Perles daily or as needed for cough.  Encouraged patient to increase daily  water intake while taking these medications.  Advised patient if symptoms worsen and/or unresolved please follow-up with PCP or here for further evaluation.     ED Prescriptions     Medication Sig Dispense Auth. Provider   albuterol (VENTOLIN HFA) 108 (90 Base) MCG/ACT inhaler Inhale 1-2 puffs into the lungs every 6 (six) hours as needed for wheezing or shortness of breath. 1 each Eliezer Lofts, FNP   amoxicillin-clavulanate (AUGMENTIN) 875-125 MG tablet Take 1 tablet by mouth 2 (two) times daily for 7 days. 14 tablet Eliezer Lofts, FNP   predniSONE (DELTASONE) 20 MG tablet Take 3 tabs PO daily x 5 days. 15 tablet Eliezer Lofts, FNP   fexofenadine [facility] ALLERGY) 180 MG tablet Take 1 tablet (180 mg total) by mouth daily for 15 days. 15 tablet Eliezer Lofts, FNP   benzonatate (TESSALON) 200 MG capsule Take 1 capsule (200 mg total) by mouth 3 (three) times daily as needed for up to 7 days. 40 capsule Eliezer Lofts, FNP      PDMP not reviewed this encounter.   Eliezer Lofts, [place]a 05/18/22 1443

## 2022-05-18 NOTE — Discharge Instructions (Addendum)
Advised patient to take medication as directed with food to completion.  Advised patient to take Allegra and prednisone with first dose of Augmentin for the next 5 of 7 days.  Advised may use Allegra as needed afterwards for concurrent postnasal drainage/drip.  Advised may use Tessalon Perles daily or as needed for cough.  Encouraged patient to increase daily water intake while taking these medications.  Advised patient if symptoms worsen and/or unresolved please follow-up with PCP or here for further evaluation.

## 2022-07-04 ENCOUNTER — Other Ambulatory Visit: Payer: Self-pay | Admitting: Physician Assistant

## 2022-07-04 DIAGNOSIS — J3489 Other specified disorders of nose and nasal sinuses: Secondary | ICD-10-CM

## 2022-08-04 ENCOUNTER — Other Ambulatory Visit: Payer: BC Managed Care – PPO

## 2022-08-18 ENCOUNTER — Ambulatory Visit
Admission: EM | Admit: 2022-08-18 | Discharge: 2022-08-18 | Disposition: A | Discharge status: Home / Self Care | Payer: BC Managed Care – PPO | Attending: Family Medicine | Admitting: Family Medicine

## 2022-08-18 DIAGNOSIS — J3089 Other allergic rhinitis: Secondary | ICD-10-CM | POA: Diagnosis not present

## 2022-08-18 DIAGNOSIS — J0141 Acute recurrent pansinusitis: Secondary | ICD-10-CM | POA: Diagnosis not present

## 2022-08-18 DIAGNOSIS — R051 Acute cough: Secondary | ICD-10-CM | POA: Diagnosis not present

## 2022-08-18 MED ORDER — PREDNISONE 20 MG PO TABS: 40.0000 mg | ORAL_TABLET | Freq: Every day | ORAL | 0 refills | Status: DC

## 2022-08-18 MED ORDER — AMOXICILLIN-POT CLAVULANATE 875-125 MG PO TABS: 1.0000 | ORAL_TABLET | Freq: Two times a day (BID) | ORAL | 0 refills | Status: DC

## 2022-08-18 NOTE — ED Provider Notes (Signed)
[name]    CSN: [id number] Arrival date & time: 08/18/22  0834      History   Chief Complaint Chief Complaint  Patient presents with   Sore Throat   Cough   Nasal Congestion    HPI Bonnie Atkins is a 41 y.o. female.   HPI  Patient has chronic respiratory infections and chronic allergies.  She is under the care of an ENT for recurrent sinusitis.  She currently has sore throat cough nasal congestion and low-grade fever for a week.  Her home COVID test was negative.  She has yellow and green mucus.  She is feeling tired.  She is starting to have a sore throat from the coughing and drainage.  She does have some laryngitis today.  Past Medical History:  Diagnosis Date   Asthma    exercise induced asthma   Bronchitis 01/22/2021   resolved with predisone    Constipation    COVID 09/2020   headache, fever fatige x 10 days all symptoms resolved   Pelvic pain    Wears glasses     Patient Active Problem List   Diagnosis Date Noted   Calcific tendinitis of right shoulder region 05/15/2020   Right shoulder pain 03/30/2020   History of partial thyroidectomy 02/08/2018   Seasonal allergies 10/31/2017   Allergic reaction to contrast dye 07/07/2017   Disease of thyroid gland 07/07/2017   Dysphagia 07/07/2017   Fish allergy 07/07/2017   Left thyroid nodule 02/03/2017   Active labor at term 04/13/2014   Supervision of other normal pregnancy 04/13/2014   Asthma 03/23/2013    Past Surgical History:  Procedure Laterality Date   BREAST EXCISIONAL BIOPSY Right    benign 20 years ago   BREAST MASS EXCISION Right yrs ago   Benign   LAPAROSCOPY N/A 02/10/2021   Procedure: LAPAROSCOPY DIAGNOSTIC;  Surgeon: Jonelle Sidle, MD;  Location: [place];  Service: Gynecology;  Laterality: N/A;   partial thryoidectomy Left 2019   cyst removed   right knee arthroscopic tendon replaced  2019   right rotator cuff repair  08/2020   [facility]   WISDOM TOOTH EXTRACTION  age 21    OB History     Gravida  1   Para  1   Term  1   Preterm      AB      Living  1      SAB      IAB      Ectopic      Multiple      Live Births  1            Home Medications    Prior to Admission medications   Medication Sig Start Date End Date Taking? Authorizing Provider  Acetaminophen-guaiFENesin [facility] COLD & FLU) 325-200 MG CAPS Take by mouth.   Yes [provider]  amoxicillin-clavulanate (AUGMENTIN) 875-125 MG tablet Take 1 tablet by mouth every 12 (twelve) hours. 08/18/22  Yes Raylene Everts, MD  predniSONE (DELTASONE) 20 MG tablet Take 2 tablets (40 mg total) by mouth daily with breakfast. 08/18/22  Yes Raylene Everts, MD  albuterol (VENTOLIN HFA) 108 (90 Base) MCG/ACT inhaler Inhale 1-2 puffs into the lungs every 6 (six) hours as needed for wheezing or shortness of breath. 05/18/22   Eliezer Lofts, FNP  norethindrone (AYGESTIN) 5 MG tablet Take 5 mg by mouth daily. 04/25/22   [provider]  Family History Family History  Problem Relation Age of Onset   Cancer Mother    Diabetes Father    Cancer Father    Breast cancer Maternal Grandmother        unsure of age    Social History Social History   Tobacco Use   Smoking status: Never   Smokeless tobacco: Never  Vaping Use   Vaping Use: Never used  Substance Use Topics   Alcohol use: No   Drug use: No     Allergies   Erythromycin, Other, Sulfa antibiotics, Codeine, and Fish-derived products   Review of Systems Review of Systems See HPI  Physical Exam Triage Vital Signs ED Triage Vitals  Enc Vitals Group     BP 08/18/22 0853 138/87     Pulse Rate 08/18/22 0853 77     Resp 08/18/22 0853 16     Temp 08/18/22 0853 99.2 F (37.3 C)     Temp Source 08/18/22 0853 Oral     SpO2 08/18/22 0853 97 %     Weight 08/18/22 0847 158 lb (71.7 kg)     Height 08/18/22 0847 '5\' 4"'$  (1.626 m)     Head Circumference --       Peak Flow --      Pain Score 08/18/22 0846 7     Pain Loc --      Pain Edu? --      Excl. in [place]? --    No data found.  Updated Vital Signs BP 138/87 (BP Location: Right Arm)   Pulse 77   Temp 99.2 F (37.3 C) (Oral)   Resp 16   Ht '5\' 4"'$  (1.626 m)   Wt 71.7 kg   LMP  (LMP Unknown)   SpO2 97%   BMI 27.12 kg/m      Physical Exam Constitutional:      General: She is not in acute distress.    Appearance: She is well-developed. She is ill-appearing.     Comments: Hoarse voice  HENT:     Head: Normocephalic and atraumatic.     Right Ear: Tympanic membrane and ear canal normal.     Left Ear: Tympanic membrane and ear canal normal.     Nose: Congestion and rhinorrhea present.     Mouth/Throat:     Mouth: Mucous membranes are moist.     Pharynx: Posterior oropharyngeal erythema present.     Tonsils: No tonsillar exudate.     Comments: His posterior pharynx is lymphoid hyperplasia and yellow postnasal drip.  Mild sinus tenderness. Eyes:     Conjunctiva/sclera: Conjunctivae normal.     Pupils: Pupils are equal, round, and reactive to light.  Cardiovascular:     Rate and Rhythm: Normal rate.  Pulmonary:     Effort: Pulmonary effort is normal. No respiratory distress.  Abdominal:     General: There is no distension.     Palpations: Abdomen is soft.  Musculoskeletal:        General: Normal range of motion.     Cervical back: Normal range of motion.  Lymphadenopathy:     Cervical: Cervical adenopathy present.  Skin:    General: Skin is warm and dry.  Neurological:     Mental Status: She is alert.  Psychiatric:        Mood and Affect: Mood normal.        Behavior: Behavior normal.      UC Treatments / Results  Labs (all labs ordered are listed, but  only abnormal results are displayed) Labs Reviewed - No data to display  EKG   Radiology No results found.  Procedures Procedures (including critical care time)  Medications Ordered in UC Medications - No  data to display  Initial Impression / Assessment and Plan / UC Course  I have reviewed the triage vital signs and the nursing notes.  Pertinent labs & imaging results that were available during my care of the patient were reviewed by me and considered in my medical decision making (see chart for details).     We will try prednisone to see if this helps with the congestion and drainage.  If she fails to improve over the next couple of days I gave her a prescription for Augmentin to fill and take.  Follow-up with ENT Final Clinical Impressions(s) / UC Diagnoses   Final diagnoses:  Acute recurrent pansinusitis  Environmental and seasonal allergies  Acute cough     Discharge Instructions      Continue your usual allergy medicines including Zyrtec, Flonase, and Nasacort May add Mucinex DM for cough Take prednisone once a day for 5 days If you fail to see improvement, fill and take the Augmentin Follow-up with your ENT Make sure you are drinking plenty of fluids   ED Prescriptions     Medication Sig Dispense Auth. Provider   predniSONE (DELTASONE) 20 MG tablet Take 2 tablets (40 mg total) by mouth daily with breakfast. 10 tablet Raylene Everts, MD   amoxicillin-clavulanate (AUGMENTIN) 875-125 MG tablet Take 1 tablet by mouth every 12 (twelve) hours. 14 tablet Raylene Everts, MD      PDMP not reviewed this encounter.   Raylene Everts, MD 08/18/22 1007

## 2022-08-18 NOTE — Discharge Instructions (Addendum)
Continue your usual allergy medicines including Zyrtec, Flonase, and Nasacort May add Mucinex DM for cough Take prednisone once a day for 5 days If you fail to see improvement, fill and take the Augmentin Follow-up with your ENT Make sure you are drinking plenty of fluids

## 2022-08-18 NOTE — ED Triage Notes (Signed)
Pt presents to Urgent Care with c/o sore throat, cough, nasal congestion, and intermittent low-grade fever x 1 week. Reports home COVID test was negative.

## 2022-12-02 ENCOUNTER — Ambulatory Visit (INDEPENDENT_AMBULATORY_CARE_PROVIDER_SITE_OTHER): Payer: BC Managed Care – PPO

## 2022-12-02 ENCOUNTER — Encounter: Payer: Self-pay | Admitting: Emergency Medicine

## 2022-12-02 ENCOUNTER — Ambulatory Visit
Admission: EM | Admit: 2022-12-02 | Discharge: 2022-12-02 | Disposition: A | Discharge status: Home / Self Care | Payer: BC Managed Care – PPO | Attending: Emergency Medicine | Admitting: Emergency Medicine

## 2022-12-02 DIAGNOSIS — M79661 Pain in right lower leg: Secondary | ICD-10-CM | POA: Diagnosis not present

## 2022-12-02 DIAGNOSIS — T1490XA Injury, unspecified, initial encounter: Secondary | ICD-10-CM | POA: Diagnosis not present

## 2022-12-02 DIAGNOSIS — M79604 Pain in right leg: Secondary | ICD-10-CM | POA: Diagnosis not present

## 2022-12-02 NOTE — ED Provider Notes (Signed)
[name]    CSN: [id number] Arrival date & time: 12/02/22  1721      History   Chief Complaint Chief Complaint  Patient presents with   Leg Pain    HPI Bonnie Atkins is a 42 y.o. female.  Presents with 1 week history of right shin pain Was kicked with a cleat while playing soccer Reports pain has worsened since then. 8/10 today Pain with weight bearing  Feels some intermittent tingling in the foot  Took 400 mg ibuprofen 2 hours ago with minimal relief   Past Medical History:  Diagnosis Date   Asthma    exercise induced asthma   Bronchitis 01/22/2021   resolved with predisone    Constipation    COVID 09/2020   headache, fever fatige x 10 days all symptoms resolved   Pelvic pain    Wears glasses     Patient Active Problem List   Diagnosis Date Noted   Calcific tendinitis of right shoulder region 05/15/2020   Right shoulder pain 03/30/2020   History of partial thyroidectomy 02/08/2018   Seasonal allergies 10/31/2017   Allergic reaction to contrast dye 07/07/2017   Disease of thyroid gland 07/07/2017   Dysphagia 07/07/2017   Fish allergy 07/07/2017   Left thyroid nodule 02/03/2017   Active labor at term 04/13/2014   Supervision of other normal pregnancy 04/13/2014   Asthma 03/23/2013    Past Surgical History:  Procedure Laterality Date   BREAST EXCISIONAL BIOPSY Right    benign 20 years ago   BREAST MASS EXCISION Right yrs ago   Benign   LAPAROSCOPY N/A 02/10/2021   Procedure: LAPAROSCOPY DIAGNOSTIC;  Surgeon: Jonelle Sidle, MD;  Location: [place];  Service: Gynecology;  Laterality: N/A;   partial thryoidectomy Left 2019   cyst removed   right knee arthroscopic tendon replaced  2019   right rotator cuff repair  08/2020   [facility]   WISDOM TOOTH EXTRACTION  age 17    OB History     Gravida  1   Para  1   Term  1   Preterm      AB      Living  1      SAB      IAB       Ectopic      Multiple      Live Births  1            Home Medications    Prior to Admission medications   Medication Sig Start Date End Date Taking? Authorizing Provider  albuterol (VENTOLIN HFA) 108 (90 Base) MCG/ACT inhaler Inhale 1-2 puffs into the lungs every 6 (six) hours as needed for wheezing or shortness of breath. 05/18/22   Eliezer Lofts, FNP  norethindrone (AYGESTIN) 5 MG tablet Take 5 mg by mouth daily. 04/25/22   [provider]    Family History Family History  Problem Relation Age of Onset   Cancer Mother    Breast cancer Mother    Diabetes Father    Cancer Father    Breast cancer Maternal Grandmother        unsure of age    Social History Social History   Tobacco Use   Smoking status: Never   Smokeless tobacco: Never  Vaping Use   Vaping Use: Never used  Substance Use Topics   Alcohol use: No   Drug use: No     Allergies   Erythromycin, Other,  Sulfa antibiotics, Codeine, and Fish-derived products   Review of Systems Review of Systems As per HPI  Physical Exam Triage Vital Signs ED Triage Vitals  Enc Vitals Group     BP 12/02/22 1751 131/86     Pulse Rate 12/02/22 1751 72     Resp 12/02/22 1751 14     Temp 12/02/22 1751 99 F (37.2 C)     Temp Source 12/02/22 1751 Oral     SpO2 12/02/22 1751 98 %     Weight 12/02/22 1754 160 lb (72.6 kg)     Height 12/02/22 1754 5' 4"$  (1.626 m)     Head Circumference --      Peak Flow --      Pain Score 12/02/22 1753 8     Pain Loc --      Pain Edu? --      Excl. in [place]? --    No data found.  Updated Vital Signs BP 131/86 (BP Location: Right Arm)   Pulse 72   Temp 99 F (37.2 C) (Oral)   Resp 14   Ht 5' 4"$  (1.626 m)   Wt 160 lb (72.6 kg)   SpO2 98%   BMI 27.46 kg/m   Visual Acuity Right Eye Distance:   Left Eye Distance:   Bilateral Distance:    Right Eye Near:   Left Eye Near:    Bilateral Near:     Physical Exam Vitals and nursing note reviewed.  Constitutional:       General: She is not in acute distress. HENT:     Mouth/Throat:     Pharynx: Oropharynx is clear.  Cardiovascular:     Rate and Rhythm: Normal rate.     Pulses: Normal pulses.  Pulmonary:     Effort: Pulmonary effort is normal.  Musculoskeletal:        General: Normal range of motion.     Right lower leg: Swelling and tenderness present.     Left lower leg: Normal.     Comments: Mild swelling to anterior RLE. Tender to touch. No bruising/skin changes. Full ROM at knee and ankle. Distal sensation intact. DP pulses 3+  Skin:    General: Skin is warm and dry.  Neurological:     Mental Status: She is alert and oriented to person, place, and time.     UC Treatments / Results  Labs (all labs ordered are listed, but only abnormal results are displayed) Labs Reviewed - No data to display  EKG   Radiology DG Tibia/Fibula Right  Result Date: 12/02/2022 CLINICAL DATA:  Kicked by chart I old with lower extremity pain, initial encounter EXAM: RIGHT TIBIA AND FIBULA - 2 VIEW COMPARISON:  None Available. FINDINGS: No acute fracture or dislocation is noted. Anterior soft tissue swelling is noted consistent with the given clinical history. IMPRESSION: Soft tissue swelling without acute bony abnormality. Electronically Signed   By: Inez Catalina M.D.   On: 12/02/2022 18:18    Procedures Procedures   Medications Ordered in UC Medications - No data to display  Initial Impression / Assessment and Plan / UC Course  I have reviewed the triage vital signs and the nursing notes.  Pertinent labs & imaging results that were available during my care of the patient were reviewed by me and considered in my medical decision making (see chart for details).  Xray of right lower leg negative for bony abnormality. Soft tissue swelling noted.  Discussed RICE therapy and ibuprofen use Applied  ace wrap in clinic She has an ortho specialist she can see if needed Return precautions discussed. Patient  agrees to plan  Final Clinical Impressions(s) / UC Diagnoses   Final diagnoses:  Soft tissue injury  Right leg pain     Discharge Instructions      Xray negative!  Rest - try to avoid heavy lifting and high impact activity Ice - apply for 20 minutes a few times daily Compression - use a compression sleeve as needed (ACE wrap) Elevation - prop up on a pillow  Continue ibuprofen - 600 or 800 mg 3x daily  Please follow with orthopedics if symptoms persist     ED Prescriptions   None    PDMP not reviewed this encounter.   Kyra Leyland 12/02/22 1931

## 2022-12-02 NOTE — ED Triage Notes (Addendum)
R leg pain x 1 week after being hit  in the shin by her daughter's cleat while playing soccer  Pain is worse not better  OTC meds Ibuprofen  454m at 1600 Pain w/ standing  Numbness to  right foot since Wednesday - intermittent

## 2022-12-02 NOTE — Discharge Instructions (Addendum)
Xray negative!  Rest - try to avoid heavy lifting and high impact activity Ice - apply for 20 minutes a few times daily Compression - use a compression sleeve as needed (ACE wrap) Elevation - prop up on a pillow  Continue ibuprofen - 600 or 800 mg 3x daily  Please follow with orthopedics if symptoms persist

## 2023-08-10 ENCOUNTER — Ambulatory Visit
Admission: EM | Admit: 2023-08-10 | Discharge: 2023-08-10 | Disposition: A | Discharge status: Home / Self Care | Payer: BC Managed Care – PPO | Attending: Family Medicine | Admitting: Family Medicine

## 2023-08-10 ENCOUNTER — Ambulatory Visit: Payer: BC Managed Care – PPO

## 2023-08-10 DIAGNOSIS — R300 Dysuria: Secondary | ICD-10-CM

## 2023-08-10 DIAGNOSIS — R059 Cough, unspecified: Secondary | ICD-10-CM | POA: Diagnosis not present

## 2023-08-10 DIAGNOSIS — R319 Hematuria, unspecified: Secondary | ICD-10-CM

## 2023-08-10 DIAGNOSIS — R0689 Other abnormalities of breathing: Secondary | ICD-10-CM

## 2023-08-10 DIAGNOSIS — R509 Fever, unspecified: Secondary | ICD-10-CM | POA: Diagnosis not present

## 2023-08-10 LAB — POCT URINALYSIS DIP (MANUAL ENTRY)
Glucose, UA: NEGATIVE mg/dL
Leukocytes, UA: NEGATIVE
Nitrite, UA: NEGATIVE
Protein Ur, POC: 100 mg/dL — AB
Spec Grav, UA: >=1.03 — AB (ref 1.010–1.025)
Urobilinogen, UA: 1 U/dL
pH, UA: 5.5 (ref 5.0–8.0)

## 2023-08-10 LAB — POCT INFLUENZA A/B
Influenza A, POC: NEGATIVE
Influenza B, POC: NEGATIVE

## 2023-08-10 LAB — POC SARS CORONAVIRUS 2 AG -  ED: SARS Coronavirus 2 Ag: NEGATIVE

## 2023-08-10 LAB — POCT URINE PREGNANCY: Preg Test, Ur: NEGATIVE

## 2023-08-10 MED ORDER — IBUPROFEN 800 MG PO TABS
800.0000 mg | ORAL_TABLET | Freq: Once | ORAL | Status: AC
  Administered 2023-08-10: 800 mg via ORAL

## 2023-08-10 MED ORDER — PROMETHAZINE-DM 6.25-15 MG/5ML PO SYRP: 5.0000 mL | ORAL_SOLUTION | Freq: Two times a day (BID) | ORAL | 0 refills | Status: DC | PRN

## 2023-08-10 MED ORDER — BENZONATATE 200 MG PO CAPS: 200.0000 mg | ORAL_CAPSULE | Freq: Three times a day (TID) | ORAL | 0 refills | Status: AC | PRN

## 2023-08-10 MED ORDER — DOXYCYCLINE HYCLATE 100 MG PO CAPS: 100.0000 mg | ORAL_CAPSULE | Freq: Two times a day (BID) | ORAL | 0 refills | Status: AC

## 2023-08-10 MED ORDER — PREDNISONE 10 MG (21) PO TBPK: ORAL_TABLET | Freq: Every day | ORAL | 0 refills | Status: DC

## 2023-08-10 NOTE — ED Provider Notes (Signed)
[facility]    CSN: [id number] Arrival date & time: 08/10/23  1308      History   Chief Complaint Chief Complaint  Patient presents with   Cough    HPI Bonnie Atkins is a 42 y.o. female.   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/08/2018   Seasonal allergies 10/31/2017   Allergic reaction to contrast dye 07/07/2017   Disease of thyroid gland 07/07/2017   Dysphagia 07/07/2017   Fish allergy 07/07/2017   Left thyroid nodule 02/03/2017   Active labor at term 04/13/2014   Supervision of other normal pregnancy 04/13/2014   Asthma 03/23/2013    Past Surgical History:  Procedure Laterality Date   BREAST EXCISIONAL BIOPSY Right    benign 20 years ago   BREAST MASS EXCISION Right yrs ago   Benign   LAPAROSCOPY N/A 02/10/2021   Procedure: LAPAROSCOPY DIAGNOSTIC;  Surgeon: Rande Brunt, MD;  Location: [facility];  Service: Gynecology;  Laterality: N/A;   partial thryoidectomy Left 2019   cyst removed   right knee arthroscopic tendon replaced  2019   right rotator cuff repair  08/2020   [facility]   WISDOM TOOTH EXTRACTION  age 82    OB History     Gravida  1   Para  1   Term  1   Preterm      AB      Living  1      SAB      IAB      Ectopic      Multiple      Live Births  1            Home Medications    Prior to Admission medications   Medication Sig Start Date End Date Taking? Authorizing Provider  benzonatate (TESSALON) 200 MG capsule Take 1 capsule (200 mg total) by mouth 3  (three) times daily as needed for up to 7 days. 08/10/23 08/17/23 Yes Trevor Iha, FNP  doxycycline (VIBRAMYCIN) 100 MG capsule Take 1 capsule (100 mg total) by mouth 2 (two) times daily for 10 days. 08/10/23 08/20/23 Yes Trevor Iha, FNP  predniSONE (STERAPRED UNI-PAK 21 TAB) 10 MG (21) TBPK tablet Take by mouth daily. Take 6 tabs by mouth daily  for 2 days, then 5 tabs for 2 days, then 4 tabs for 2 days, then 3 tabs for 2 days, 2 tabs for 2 days, then 1 tab by mouth daily for 2 days 08/10/23  Yes Trevor Iha, FNP  promethazine-dextromethorphan (PROMETHAZINE-DM) 6.25-15 MG/5ML syrup Take 5 mLs by mouth 2 (two) times daily as needed for cough. 08/10/23  Yes Trevor Iha, FNP  albuterol (VENTOLIN HFA) 108 (90 Base) MCG/ACT inhaler Inhale 1-2 puffs into the lungs every 6 (six) hours as needed for wheezing or shortness of breath. 05/18/22   Trevor Iha, FNP  norethindrone (AYGESTIN) 5 MG tablet Take 5 mg by mouth daily. 04/25/22   [provider]    Family History  Family History  Problem Relation Age of Onset   Cancer Mother    Breast cancer Mother    Diabetes Father    Cancer Father    Breast cancer Maternal Grandmother        unsure of age    Social History Social History   Tobacco Use   Smoking status: Never   Smokeless tobacco: Never  Vaping Use   Vaping status: Never Used  Substance Use Topics   Alcohol use: No   Drug use: No     Allergies   Erythromycin, Other, Sulfa antibiotics, Codeine, and Fish-derived products   Review of Systems Review of Systems  All other systems reviewed and are negative.    Physical Exam Triage Vital Signs ED Triage Vitals [08/10/23 0908]  Encounter Vitals Group     BP 137/85     Systolic BP Percentile      Diastolic BP Percentile      Pulse Rate (!) 103     Resp 18     Temp (!) 100.4 F (38 C)     Temp Source Oral     SpO2 97 %     Weight      Height      Head Circumference      Peak Flow      Pain Score 6      Pain Loc      Pain Education      Exclude from Growth Chart    No data found.  Updated Vital Signs BP 137/85 (BP Location: Left Arm)   Pulse (!) 103   Temp 99 F (37.2 C)   Resp 18   SpO2 97%    Physical Exam Vitals and nursing note reviewed.  Constitutional:      Appearance: Normal appearance.  HENT:     Head: Normocephalic and atraumatic.     Mouth/Throat:     Mouth: Mucous membranes are moist.     Pharynx: Oropharynx is clear.  Eyes:     Extraocular Movements: Extraocular movements intact.     Conjunctiva/sclera: Conjunctivae normal.     Pupils: Pupils are equal, round, and reactive to light.  Cardiovascular:     Rate and Rhythm: Normal rate and regular rhythm.     Pulses: Normal pulses.     Heart sounds: Normal heart sounds.  Pulmonary:     Effort: Pulmonary effort is normal.     Breath sounds: No wheezing, rhonchi or rales.     Comments: Diminished breath sounds on exam with frequent cough Musculoskeletal:        General: Normal range of motion.     Cervical back: Normal range of motion and neck supple.  Skin:    General: Skin is warm and dry.  Neurological:     General: No focal deficit present.     Mental Status: She is alert and oriented to person, place, and time. Mental status is at baseline.  Psychiatric:        Mood and Affect: Mood normal.        Behavior: Behavior normal.        Thought Content: Thought content normal.      UC Treatments / Results  Labs (all labs ordered are listed, but only abnormal results are displayed) Labs Reviewed  POCT URINALYSIS DIP (MANUAL ENTRY) - Abnormal; Notable for the following components:      Result Value   Color, UA brown (*)    Bilirubin, UA small (*)  Ketones, POC UA large (80) (*)    Spec Grav, UA >=1.030 (*)    Blood, UA large (*)    Protein Ur, POC =100 (*)    All other components within normal limits  POCT INFLUENZA A/B  POCT URINE PREGNANCY  POC SARS CORONAVIRUS 2 AG -  ED     EKG   Radiology DG Chest 2 View  Result Date: 08/10/2023 CLINICAL DATA:  Provided history: Cough. Diminished breath sounds. Concern for pneumonia. EXAM: CHEST - 2 VIEW COMPARISON:  Prior chest radiographs 10/22/2019 and earlier. FINDINGS: Heart size within normal limits. No appreciable airspace consolidation. No evidence of pleural effusion or pneumothorax. No acute osseous abnormality identified. IMPRESSION: No evidence of active cardiopulmonary disease. Electronically Signed   By: Jackey Loge D.O.   On: 08/10/2023 11:21    Procedures Procedures (including critical care time)  Medications Ordered in UC Medications  ibuprofen (ADVIL) tablet 800 mg (800 mg Oral Given 08/10/23 0924)    Initial Impression / Assessment and Plan / UC Course  I have reviewed the triage vital signs and the nursing notes.  Pertinent labs & imaging results that were available during my care of the patient were reviewed by me and considered in my medical decision making (see chart for details).     MDM: 1.  Cough, unspecified type-CXR revealed above, Rx'd Doxycycline 100 mg capsule: Take 1 capsule twice daily x 10 days, Sterapred Unipak (tapering from 60 mg to 10 mg over 10 days), Tessalon 200 mg capsules: Take 1 capsule 3 times daily, as needed for cough, promethazine DM 6.25-15 mg/5 mL syrup: Take 5 mL twice daily for cough, as needed. Advised patient of chest x-ray results with hardcopy provided.  Advised patient to take medications as directed with food to completion.  Advised patient to take Prednisone with first dose of Doxycycline for the next 10 days.  Advised may take Tessalon capsules daily or as needed for cough.  Advised may use Promethazine DM at night prior to sleep for cough due to sedative effects. 2.  Dysuria, UA revealed above; 3.  Fever, unspecified-Advised patient may take OTC Tylenol 1 g every 6 hours for fever (oral temperature greater than 100.3).;  4.  Hematuria, unspecified type-UA  revealed above.  Encouraged increase daily water intake to 64 ounces per day while taking these medications.  Advised if symptoms worsen and/or unresolved please follow-up PCP or here for further evaluation.  Patient discharged home, hemodynamically stable, work note provided to patient per her request prior to discharge today. Final Clinical Impressions(s) / UC Diagnoses   Final diagnoses:  Cough, unspecified type  Dysuria  Fever, unspecified  Hematuria, unspecified type     Discharge Instructions      Advised patient of chest x-ray results with hardcopy provided.  Advised patient to take medications as directed with food to completion.  Advised patient to take Prednisone with first dose of Doxycycline for the next 10 days.  Advised may take Tessalon capsules daily or as needed for cough.  Advised may use Promethazine DM at night prior to sleep for cough due to sedative effects.  Advised patient may take OTC Tylenol 1 g every 6 hours for fever (oral temperature greater than 100.3).  Encouraged increase daily water intake to 64 ounces per day while taking these medications.  Advised if symptoms worsen and/or unresolved please follow-up PCP or here for further evaluation.     ED Prescriptions     Medication Sig Dispense Auth. Provider  doxycycline (VIBRAMYCIN) 100 MG capsule Take 1 capsule (100 mg total) by mouth 2 (two) times daily for 10 days. 20 capsule Trevor Iha, FNP   predniSONE (STERAPRED UNI-PAK 21 TAB) 10 MG (21) TBPK tablet Take by mouth daily. Take 6 tabs by mouth daily  for 2 days, then 5 tabs for 2 days, then 4 tabs for 2 days, then 3 tabs for 2 days, 2 tabs for 2 days, then 1 tab by mouth daily for 2 days 42 tablet Trevor Iha, FNP   benzonatate (TESSALON) 200 MG capsule Take 1 capsule (200 mg total) by mouth 3 (three) times daily as needed for up to 7 days. 40 capsule Trevor Iha, FNP   promethazine-dextromethorphan (PROMETHAZINE-DM) 6.25-15 MG/5ML syrup Take 5 mLs  by mouth 2 (two) times daily as needed for cough. 118 mL Trevor Iha, FNP      PDMP not reviewed this encounter.   Trevor Iha, FNP 08/10/23 1143

## 2023-08-10 NOTE — ED Triage Notes (Signed)
Patient with c/o cough, fever and chills for about 3 days. Patient also c/o burning with urination.

## 2023-08-10 NOTE — Discharge Instructions (Addendum)
Advised patient of chest x-ray results with hardcopy provided.  Advised patient to take medications as directed with food to completion.  Advised patient to take Prednisone with first dose of Doxycycline for the next 10 days.  Advised may take Tessalon capsules daily or as needed for cough.  Advised may use Promethazine DM at night prior to sleep for cough due to sedative effects.  Advised patient may take OTC Tylenol 1 g every 6 hours for fever (oral temperature greater than 100.3).  Encouraged increase daily water intake to 64 ounces per day while taking these medications.  Advised if symptoms worsen and/or unresolved please follow-up PCP or here for further evaluation.

## 2024-11-07 ENCOUNTER — Ambulatory Visit: Admission: EM | Admit: 2024-11-07 | Discharge: 2024-11-07 | Disposition: A | Discharge status: Home / Self Care

## 2024-11-07 ENCOUNTER — Ambulatory Visit

## 2024-11-07 DIAGNOSIS — J40 Bronchitis, not specified as acute or chronic: Secondary | ICD-10-CM | POA: Diagnosis not present

## 2024-11-07 DIAGNOSIS — R079 Chest pain, unspecified: Secondary | ICD-10-CM

## 2024-11-07 DIAGNOSIS — R059 Cough, unspecified: Secondary | ICD-10-CM

## 2024-11-07 DIAGNOSIS — R051 Acute cough: Secondary | ICD-10-CM | POA: Diagnosis not present

## 2024-11-07 DIAGNOSIS — B354 Tinea corporis: Secondary | ICD-10-CM

## 2024-11-07 MED ORDER — METHYLPREDNISOLONE 4 MG PO TBPK: ORAL_TABLET | ORAL | 0 refills | Status: AC

## 2024-11-07 MED ORDER — CLOTRIMAZOLE 1 % EX CREA: TOPICAL_CREAM | CUTANEOUS | 0 refills | Status: AC

## 2024-11-07 MED ORDER — PROMETHAZINE-DM 6.25-15 MG/5ML PO SYRP: 5.0000 mL | ORAL_SOLUTION | Freq: Every evening | ORAL | 0 refills | Status: AC | PRN

## 2024-11-07 MED ORDER — BENZONATATE 100 MG PO CAPS: 100.0000 mg | ORAL_CAPSULE | Freq: Three times a day (TID) | ORAL | 0 refills | Status: AC

## 2024-11-07 MED ORDER — ALBUTEROL SULFATE HFA 108 (90 BASE) MCG/ACT IN AERS: 1.0000 | INHALATION_SPRAY | Freq: Four times a day (QID) | RESPIRATORY_TRACT | 0 refills | Status: AC | PRN

## 2024-11-07 NOTE — ED Provider Notes (Signed)
 " [facility]    CSN: [id number] Arrival date & time: 11/07/24  1808      History   Chief Complaint Chief Complaint  Patient presents with   Cough    HPI Krystan Northrop is a 44 y.o. female.   Patient presents with persistent cough for approximately 1 month.  Patient reports that she was initially sick around Christmas and was treated with antibiotics with pain to feel better and then 10 days later began having a persistent cough and sinus pressure.    Patient reports that she just finished a round of antibiotics earlier this week that was prescribed by her primary care provider, but continues to have progressively worsening cough and now has chest pain and shortness of breath with coughing.  Patient also endorses significant sore throat and headache which she believes is also from coughing.    Patient reports that she has been taking Robitussin, Claritin, and using albuterol  inhaler, Advair inhaler, and nasal spray with minimal relief.  Patient does report a history of asthma.  Patient also reports that she noticed a rash to her chest today.  Patient reports the rash is itchy.  Patient reports that she has been applying hydrocortisone cream with minimal relief.    The history is provided by the patient and medical records.  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/08/2018   Seasonal allergies 10/31/2017   Allergic reaction to contrast dye 07/07/2017   Disease of thyroid gland 07/07/2017   Dysphagia 07/07/2017   Fish allergy 07/07/2017   Left thyroid nodule  02/03/2017   Active labor at term 04/13/2014   Supervision of other normal pregnancy 04/13/2014   Asthma 03/23/2013    Past Surgical History:  Procedure Laterality Date   BREAST EXCISIONAL BIOPSY Right    benign 20 years ago   BREAST MASS EXCISION Right yrs ago   Benign   LAPAROSCOPY N/A 02/10/2021   Procedure: LAPAROSCOPY DIAGNOSTIC;  Surgeon: Taam-Akelman, Rosalea K, MD;  Location: [facility];  Service: Gynecology;  Laterality: N/A;   partial thryoidectomy Left 2019   cyst removed   right knee arthroscopic tendon replaced  2019   right rotator cuff repair  08/2020   [facility]   WISDOM TOOTH EXTRACTION  age 34    OB History     Gravida  1   Para  1   Term  1   Preterm      AB      Living  1      SAB      IAB      Ectopic      Multiple      Live Births  1            Home Medications    Prior to Admission medications  Medication Sig Start Date End Date Taking? Authorizing Provider  Azelastine-Fluticasone 137-50 MCG/ACT SUSP Place 1 spray into the nose 2 (two) times daily. 10/25/24  Yes [provider]  cetirizine (ZYRTEC) 10 MG tablet Take 10 mg by mouth daily. 01/03/19  Yes [provider]  fluticasone-salmeterol (ADVAIR) 100-50 MCG/ACT AEPB Inhale 1 puff into the lungs 2 (two) times daily. 11/05/24  Yes [provider]  norethindrone (AYGESTIN) 5 MG tablet Take 5 mg by mouth daily. 04/25/22  Yes [provider]  triamcinolone (NASACORT) 55 MCG/ACT AERO nasal inhaler Place 2 sprays into the nose daily. 05/30/22  Yes [provider]  valACYclovir (VALTREX) 1000 MG tablet Take 1,000 mg by mouth 3 (three) times daily. 07/29/24  Yes [provider]  albuterol  (VENTOLIN  HFA) 108 (90 Base) MCG/ACT inhaler Inhale 1-2 puffs into the lungs every 6 (six) hours as needed for wheezing or shortness of breath. 11/07/24   Johnie Flaming A, NP  benzonatate  (TESSALON ) 100 MG capsule Take  1 capsule (100 mg total) by mouth every 8 (eight) hours. 11/07/24  Yes Johnie Flaming A, NP  clotrimazole  (LOTRIMIN ) 1 % cream Apply to affected area 2 times daily 11/07/24  Yes Johnie, Saqib Cazarez A, NP  loratadine (CLARITIN) 10 MG tablet Take 10 mg by mouth daily.    [provider]  methylPREDNISolone  (MEDROL  DOSEPAK) 4 MG TBPK tablet Take as instructed to on the box. 11/07/24  Yes Johnie Flaming A, NP  promethazine -dextromethorphan (PROMETHAZINE -DM) 6.25-15 MG/5ML syrup Take 5 mLs by mouth at bedtime as needed for cough. 11/07/24  Yes Johnie Flaming LABOR, NP    Family History Family History  Problem Relation Age of Onset   Cancer Mother    Breast cancer Mother    Diabetes Father    Cancer Father    Breast cancer Maternal Grandmother        unsure of age    Social History Social History[1]   Allergies   Erythromycin, Other, Sulfa antibiotics, Codeine, and Fish protein-containing drug products   Review of Systems Review of Systems  Respiratory:  Positive for cough.    Per HPI  Physical Exam Triage Vital Signs ED Triage Vitals  Encounter Vitals Group     BP 11/07/24 1849 (!) 142/90     Girls Systolic BP Percentile --      Girls Diastolic BP Percentile --      Boys Systolic BP Percentile --      Boys Diastolic BP Percentile --      Pulse Rate 11/07/24 1849 76     Resp 11/07/24 1849 20     Temp 11/07/24 1849 99 F (37.2 C)     Temp Source 11/07/24 1849 Oral     SpO2 11/07/24 1849 98 %     Weight --      Height --      Head Circumference --      Peak Flow --      Pain Score 11/07/24 1850 9     Pain Loc --      Pain Education --      Exclude from Growth Chart --    No data found.  Updated Vital Signs BP (!) 142/90 (BP Location: Right Arm)   Pulse 76   Temp 99 F (37.2 C) (Oral)   Resp 20   LMP  (LMP Unknown)   SpO2 98%   Visual Acuity Right Eye Distance:   Left Eye Distance:   Bilateral Distance:    Right Eye Near:   Left Eye Near:     Bilateral Near:     Physical Exam Vitals and nursing note reviewed.  Constitutional:  General: She is awake. She is not in acute distress.    Appearance: Normal appearance. She is well-developed and well-groomed. She is ill-appearing. She is not toxic-appearing or diaphoretic.  HENT:     Right Ear: Tympanic membrane, ear canal and external ear normal.     Left Ear: Tympanic membrane, ear canal and external ear normal.     Nose: Congestion and rhinorrhea present.     Mouth/Throat:     Mouth: Mucous membranes are moist.     Pharynx: Posterior oropharyngeal erythema and postnasal drip present. No oropharyngeal exudate.  Cardiovascular:     Rate and Rhythm: Normal rate and regular rhythm.  Pulmonary:     Effort: Pulmonary effort is normal.     Breath sounds: Normal breath sounds.  Chest:    Skin:    General: Skin is warm and dry.     Findings: Rash present.     Comments: Circular raised patches of erythema that appear to be consistent with tinea.  Neurological:     General: No focal deficit present.     Mental Status: She is alert and oriented to person, place, and time. Mental status is at baseline.  Psychiatric:        Behavior: Behavior is cooperative.      UC Treatments / Results  Labs (all labs ordered are listed, but only abnormal results are displayed) Labs Reviewed - No data to display  EKG   Radiology DG Chest 2 View Result Date: 11/07/2024 EXAM: 2 VIEW(S) XRAY OF THE CHEST 11/07/2024 07:22:00 PM COMPARISON: Comparison chest x ray 08/10/2023. CLINICAL HISTORY: Cough for one month, chest pain with cough. FINDINGS: LUNGS AND PLEURA: No focal pulmonary opacity. No pleural effusion. No pneumothorax. HEART AND MEDIASTINUM: No acute abnormality of the cardiac and mediastinal silhouettes. BONES AND SOFT TISSUES: No acute osseous abnormality. IMPRESSION: 1. No acute cardiopulmonary process. Electronically signed by: Greig Pique MD 11/07/2024 07:25 PM EST RP  Workstation: [id number]    Procedures Procedures (including critical care time)  Medications Ordered in UC Medications - No data to display  Initial Impression / Assessment and Plan / UC Course  I have reviewed the triage vital signs and the nursing notes.  Pertinent labs & imaging results that were available during my care of the patient were reviewed by me and considered in my medical decision making (see chart for details).     Patient is mildly ill-appearing.  Vitals are overall stable.  Chest x-ray ordered.  I independently interpreted these images and there is no active cardiopulmonary disease at this time.  Radiology report confirms this.    Suspect symptoms are likely related to an ongoing bronchitis.  Prescribed Medrol  Dosepak for this.  Prescribed Tessalon  and Promethazine  DM as needed for cough.  Discussed when to use previously prescribed inhalers.  Discussed over-the-counter medications for symptoms.  Rash appears to be consistent with tinea corporis.  Prescribed clotrimazole  for this.  Discussed follow-up and return precautions.  Upon discharge patient did request a refill of an albuterol  inhaler.  I have sent this into the pharmacy. Final Clinical Impressions(s) / UC Diagnoses   Final diagnoses:  Acute cough  Bronchitis  Tinea corporis     Discharge Instructions      Your x-ray does not reveal any evidence of pneumonia.  As discussed I believe your symptoms are likely related to an ongoing bronchitis. Start taking Medrol  Dosepak as instructed to on the package to help with inflammation related to this. You can take  Tessalon  every 8 hours as needed for cough. You can take Promethazine  DM cough syrup as needed for cough.  This can make you drowsy so do not drive, work, or drink alcohol while taking this. Otherwise continue using your Advair inhaler daily and using your albuterol  inhaler every 4-6 hours as needed for any shortness of breath or wheezing. You can  also take over-the-counter Mucinex for cough and congestion.  I prescribed clotrimazole  cream to apply to the rash on your chest.  Apply this twice daily. Follow-up with your primary care provider or return here as needed.     ED Prescriptions     Medication Sig Dispense Auth. Provider   clotrimazole  (LOTRIMIN ) 1 % cream Apply to affected area 2 times daily 15 g Johnie Flaming A, NP   benzonatate  (TESSALON ) 100 MG capsule Take 1 capsule (100 mg total) by mouth every 8 (eight) hours. 21 capsule Johnie Flaming A, NP   promethazine -dextromethorphan (PROMETHAZINE -DM) 6.25-15 MG/5ML syrup Take 5 mLs by mouth at bedtime as needed for cough. 118 mL Jannis Atkins A, NP   methylPREDNISolone  (MEDROL  DOSEPAK) 4 MG TBPK tablet Take as instructed to on the box. 1 each Johnie Flaming A, NP   albuterol  (VENTOLIN  HFA) 108 (90 Base) MCG/ACT inhaler Inhale 1-2 puffs into the lungs every 6 (six) hours as needed for wheezing or shortness of breath. 1 each Johnie Flaming LABOR, NP      PDMP not reviewed this encounter.    [1]  Social History Tobacco Use   Smoking status: Never   Smokeless tobacco: Never  Vaping Use   Vaping status: Never Used  Substance Use Topics   Alcohol use: No   Drug use: No     Johnie Flaming LABOR, NP 11/07/24 1934  "

## 2024-11-07 NOTE — Discharge Instructions (Addendum)
 Your x-ray does not reveal any evidence of pneumonia.  As discussed I believe your symptoms are likely related to an ongoing bronchitis. Start taking Medrol  Dosepak as instructed to on the package to help with inflammation related to this. You can take Tessalon  every 8 hours as needed for cough. You can take Promethazine  DM cough syrup as needed for cough.  This can make you drowsy so do not drive, work, or drink alcohol while taking this. Otherwise continue using your Advair inhaler daily and using your albuterol  inhaler every 4-6 hours as needed for any shortness of breath or wheezing. You can also take over-the-counter Mucinex for cough and congestion.  I prescribed clotrimazole  cream to apply to the rash on your chest.  Apply this twice daily. Follow-up with your primary care provider or return here as needed.

## 2024-11-07 NOTE — ED Triage Notes (Signed)
 Hx of asthma- States was sick at Christmas and treated with antibiotics. Felt better then 10 days later she had persistent cough and sinus pressure and was given another round of antibiotics. Now c/o chest tightness, sore throat, headache, cough, SOB and today noticed a rash. She has been taking robitussin, albuterol  inhaler, fluticasone inhaler, nasal spray, and claritin
